# Patient Record
Sex: Male | Born: 1965 | Race: White | Hispanic: No | Marital: Married | Smoking: Never smoker
Health system: Southern US, Community
[De-identification: ages and names within clinical notes are randomized; demographics above are authoritative.]

## PROBLEM LIST (undated history)

## (undated) DIAGNOSIS — K4090 Unilateral inguinal hernia, without obstruction or gangrene, not specified as recurrent: Secondary | ICD-10-CM

## (undated) DIAGNOSIS — C20 Malignant neoplasm of rectum: Secondary | ICD-10-CM

## (undated) DIAGNOSIS — C801 Malignant (primary) neoplasm, unspecified: Secondary | ICD-10-CM

## (undated) DIAGNOSIS — K219 Gastro-esophageal reflux disease without esophagitis: Secondary | ICD-10-CM

## (undated) DIAGNOSIS — Z923 Personal history of irradiation: Secondary | ICD-10-CM

## (undated) HISTORY — PX: OTHER SURGICAL HISTORY: SHX169

## (undated) HISTORY — PX: RECTAL SURGERY: SHX760

## (undated) HISTORY — DX: Malignant (primary) neoplasm, unspecified: C80.1

## (undated) HISTORY — DX: Personal history of irradiation: Z92.3

---

## 2001-06-09 ENCOUNTER — Encounter: Payer: Self-pay | Admitting: Family Medicine

## 2001-06-09 ENCOUNTER — Ambulatory Visit (HOSPITAL_COMMUNITY): Admission: RE | Admit: 2001-06-09 | Discharge: 2001-06-09 | Payer: Self-pay | Admitting: Family Medicine

## 2004-10-27 ENCOUNTER — Emergency Department (HOSPITAL_COMMUNITY): Admission: EM | Admit: 2004-10-27 | Discharge: 2004-10-27 | Payer: Self-pay | Admitting: Emergency Medicine

## 2009-05-18 ENCOUNTER — Encounter: Admission: RE | Admit: 2009-05-18 | Discharge: 2009-05-18 | Payer: Self-pay | Admitting: Family Medicine

## 2010-11-06 ENCOUNTER — Ambulatory Visit (HOSPITAL_COMMUNITY)
Admission: RE | Admit: 2010-11-06 | Discharge: 2010-11-06 | Payer: Self-pay | Source: Home / Self Care | Admitting: Gastroenterology

## 2010-11-12 ENCOUNTER — Ambulatory Visit (HOSPITAL_COMMUNITY)
Admission: RE | Admit: 2010-11-12 | Discharge: 2010-11-12 | Payer: Self-pay | Source: Home / Self Care | Attending: Gastroenterology | Admitting: Gastroenterology

## 2010-12-06 LAB — CBC
HCT: 44.2 % (ref 39.0–52.0)
Hemoglobin: 15.2 g/dL (ref 13.0–17.0)
MCH: 30.5 pg (ref 26.0–34.0)
MCHC: 34.4 g/dL (ref 30.0–36.0)
MCV: 88.8 fL (ref 78.0–100.0)
Platelets: 226 10*3/uL (ref 150–400)
RBC: 4.98 MIL/uL (ref 4.22–5.81)
RDW: 12.5 % (ref 11.5–15.5)
WBC: 7.7 10*3/uL (ref 4.0–10.5)

## 2010-12-06 LAB — COMPREHENSIVE METABOLIC PANEL
ALT: 19 U/L (ref 0–53)
AST: 21 U/L (ref 0–37)
Albumin: 4.4 g/dL (ref 3.5–5.2)
Alkaline Phosphatase: 62 U/L (ref 39–117)
BUN: 17 mg/dL (ref 6–23)
CO2: 28 mEq/L (ref 19–32)
Calcium: 9.8 mg/dL (ref 8.4–10.5)
Chloride: 101 mEq/L (ref 96–112)
Creatinine, Ser: 0.94 mg/dL (ref 0.4–1.5)
GFR calc Af Amer: >60 mL/min (ref >60)
GFR calc non Af Amer: >60 mL/min (ref >60)
Glucose, Bld: 100 mg/dL — ABNORMAL HIGH (ref 70–99)
Potassium: 4.8 mEq/L (ref 3.5–5.1)
Sodium: 137 mEq/L (ref 135–145)
Total Bilirubin: 0.6 mg/dL (ref 0.3–1.2)
Total Protein: 7.1 g/dL (ref 6.0–8.3)

## 2010-12-06 LAB — DIFFERENTIAL
Basophils Absolute: 0 10*3/uL (ref 0.0–0.1)
Basophils Relative: 0 % (ref 0–1)
Eosinophils Absolute: 0.1 10*3/uL (ref 0.0–0.7)
Eosinophils Relative: 2 % (ref 0–5)
Lymphocytes Relative: 27 % (ref 12–46)
Lymphs Abs: 2.1 10*3/uL (ref 0.7–4.0)
Monocytes Absolute: 0.9 10*3/uL (ref 0.1–1.0)
Monocytes Relative: 11 % (ref 3–12)
Neutro Abs: 4.5 10*3/uL (ref 1.7–7.7)
Neutrophils Relative %: 59 % (ref 43–77)

## 2010-12-10 ENCOUNTER — Encounter (INDEPENDENT_AMBULATORY_CARE_PROVIDER_SITE_OTHER): Payer: Self-pay | Admitting: Surgery

## 2010-12-10 ENCOUNTER — Inpatient Hospital Stay (HOSPITAL_COMMUNITY)
Admission: RE | Admit: 2010-12-10 | Discharge: 2010-12-14 | Payer: Self-pay | Source: Home / Self Care | Attending: Surgery | Admitting: Surgery

## 2010-12-10 DIAGNOSIS — C20 Malignant neoplasm of rectum: Secondary | ICD-10-CM | POA: Insufficient documentation

## 2010-12-10 DIAGNOSIS — C801 Malignant (primary) neoplasm, unspecified: Secondary | ICD-10-CM

## 2010-12-10 HISTORY — DX: Malignant (primary) neoplasm, unspecified: C80.1

## 2010-12-16 LAB — COMPREHENSIVE METABOLIC PANEL
ALT: 12 U/L (ref 0–53)
AST: 17 U/L (ref 0–37)
Albumin: 2.4 g/dL — ABNORMAL LOW (ref 3.5–5.2)
Alkaline Phosphatase: 41 U/L (ref 39–117)
BUN: 10 mg/dL (ref 6–23)
CO2: 30 mEq/L (ref 19–32)
Calcium: 8.1 mg/dL — ABNORMAL LOW (ref 8.4–10.5)
Chloride: 104 mEq/L (ref 96–112)
Creatinine, Ser: 0.96 mg/dL (ref 0.4–1.5)
GFR calc Af Amer: >60 mL/min (ref >60)
GFR calc non Af Amer: >60 mL/min (ref >60)
Glucose, Bld: 94 mg/dL (ref 70–99)
Potassium: 3.7 mEq/L (ref 3.5–5.1)
Sodium: 138 mEq/L (ref 135–145)
Total Bilirubin: 0.8 mg/dL (ref 0.3–1.2)
Total Protein: 4.5 g/dL — ABNORMAL LOW (ref 6.0–8.3)

## 2010-12-16 LAB — CBC
HCT: 31.3 % — ABNORMAL LOW (ref 39.0–52.0)
Hemoglobin: 10.5 g/dL — ABNORMAL LOW (ref 13.0–17.0)
MCH: 29.7 pg (ref 26.0–34.0)
MCHC: 33.5 g/dL (ref 30.0–36.0)
MCV: 88.4 fL (ref 78.0–100.0)
Platelets: 167 10*3/uL (ref 150–400)
RBC: 3.54 MIL/uL — ABNORMAL LOW (ref 4.22–5.81)
RDW: 12.6 % (ref 11.5–15.5)
WBC: 11.1 10*3/uL — ABNORMAL HIGH (ref 4.0–10.5)

## 2010-12-16 LAB — CEA: CEA: 2.1 ng/mL (ref 0.0–5.0)

## 2010-12-24 ENCOUNTER — Ambulatory Visit: Payer: Self-pay | Admitting: Hematology and Oncology

## 2010-12-26 LAB — SURGICAL PCR SCREEN
MRSA, PCR: NEGATIVE
Staphylococcus aureus: NEGATIVE

## 2010-12-27 LAB — CBC WITH DIFFERENTIAL/PLATELET
BASO%: 0.5 % (ref 0.0–2.0)
Basophils Absolute: 0 10*3/uL (ref 0.0–0.1)
EOS%: 1.2 % (ref 0.0–7.0)
Eosinophils Absolute: 0.1 10*3/uL (ref 0.0–0.5)
HCT: 37.4 % — ABNORMAL LOW (ref 38.4–49.9)
HGB: 12.8 g/dL — ABNORMAL LOW (ref 13.0–17.1)
LYMPH%: 31.3 % (ref 14.0–49.0)
MCH: 30.6 pg (ref 27.2–33.4)
MCHC: 34.2 g/dL (ref 32.0–36.0)
MCV: 89.4 fL (ref 79.3–98.0)
MONO#: 0.8 10*3/uL (ref 0.1–0.9)
MONO%: 9.6 % (ref 0.0–14.0)
NEUT#: 5 10*3/uL (ref 1.5–6.5)
NEUT%: 57.4 % (ref 39.0–75.0)
Platelets: 310 10*3/uL (ref 140–400)
RBC: 4.18 10*6/uL — ABNORMAL LOW (ref 4.20–5.82)
RDW: 13.5 % (ref 11.0–14.6)
WBC: 8.8 10*3/uL (ref 4.0–10.3)
lymph#: 2.8 10*3/uL (ref 0.9–3.3)

## 2010-12-27 LAB — COMPREHENSIVE METABOLIC PANEL
ALT: 46 U/L (ref 0–53)
AST: 21 U/L (ref 0–37)
Albumin: 4 g/dL (ref 3.5–5.2)
Alkaline Phosphatase: 53 U/L (ref 39–117)
BUN: 16 mg/dL (ref 6–23)
CO2: 28 mEq/L (ref 19–32)
Calcium: 9.1 mg/dL (ref 8.4–10.5)
Chloride: 104 mEq/L (ref 96–112)
Creatinine, Ser: 0.86 mg/dL (ref 0.40–1.50)
Glucose, Bld: 79 mg/dL (ref 70–99)
Potassium: 3.9 mEq/L (ref 3.5–5.3)
Sodium: 139 mEq/L (ref 135–145)
Total Bilirubin: 0.6 mg/dL (ref 0.3–1.2)
Total Protein: 6.9 g/dL (ref 6.0–8.3)

## 2010-12-27 LAB — CEA: CEA: 2 ng/mL (ref 0.0–5.0)

## 2010-12-30 ENCOUNTER — Ambulatory Visit (HOSPITAL_COMMUNITY)
Admission: RE | Admit: 2010-12-30 | Discharge: 2010-12-30 | Payer: Self-pay | Source: Home / Self Care | Attending: Hematology and Oncology | Admitting: Hematology and Oncology

## 2011-01-01 ENCOUNTER — Ambulatory Visit: Payer: PRIVATE HEALTH INSURANCE | Attending: Radiation Oncology | Admitting: Radiation Oncology

## 2011-01-01 DIAGNOSIS — Z510 Encounter for antineoplastic radiation therapy: Secondary | ICD-10-CM | POA: Insufficient documentation

## 2011-01-01 DIAGNOSIS — K644 Residual hemorrhoidal skin tags: Secondary | ICD-10-CM | POA: Insufficient documentation

## 2011-01-01 DIAGNOSIS — Z8042 Family history of malignant neoplasm of prostate: Secondary | ICD-10-CM | POA: Insufficient documentation

## 2011-01-01 DIAGNOSIS — Z807 Family history of other malignant neoplasms of lymphoid, hematopoietic and related tissues: Secondary | ICD-10-CM | POA: Insufficient documentation

## 2011-01-01 DIAGNOSIS — R197 Diarrhea, unspecified: Secondary | ICD-10-CM | POA: Insufficient documentation

## 2011-01-01 DIAGNOSIS — Z79899 Other long term (current) drug therapy: Secondary | ICD-10-CM | POA: Insufficient documentation

## 2011-01-01 DIAGNOSIS — Z801 Family history of malignant neoplasm of trachea, bronchus and lung: Secondary | ICD-10-CM | POA: Insufficient documentation

## 2011-01-01 DIAGNOSIS — C186 Malignant neoplasm of descending colon: Secondary | ICD-10-CM | POA: Insufficient documentation

## 2011-01-01 DIAGNOSIS — Z800 Family history of malignant neoplasm of digestive organs: Secondary | ICD-10-CM | POA: Insufficient documentation

## 2011-01-07 NOTE — Discharge Summary (Signed)
  [name], Devon Bray             ACCOUNT NO.:  [id number]  MEDICAL RECORD NO.:  [id number]          PATIENT TYPE:  INP  LOCATION:  5150                         FACILITY:  [facility]  PHYSICIAN:  [place] A. Narmeen Kerper, M.D.DATE OF BIRTH:  1966-08-14  DATE OF ADMISSION:  12/10/2010 DATE OF DISCHARGE:  12/14/2010                              DISCHARGE SUMMARY   ADMITTING DIAGNOSIS:  Rectal cancer.  DISCHARGE DIAGNOSIS:  Rectal cancer.  PROCEDURES PERFORMED:  Laparoscopic low anterior resection.  BRIEF HISTORY:  The patient is a pleasant 45 year old male who has low rectal cancer.  He presents for resection.  HOSPITAL COURSE:  Unremarkable.  Bowel function returned by postop day #2.  He was discharged on postoperative day 4.  The wound was clean, dry, and intact.  Bowel function have resumed.  His diet was regular. Pain well controlled on p.o. pain medicines.  DISCHARGE INSTRUCTIONS:  He will follow up in 1-2 weeks.  He will refrain from lifting, driving, pushing, pulling until I see him back. He will go home on a regular diet.  He will be sent on Percocet for pain.  CONDITION ON DISCHARGE:  Improved.     [name], M.D.     TAC/MEDQ  D:  12/29/2010  T:  12/29/2010  Job:  [id number]  Electronically Signed by Harriette Bouillon M.D. on 01/07/2011 06:45:41 PM

## 2011-01-15 ENCOUNTER — Encounter (HOSPITAL_BASED_OUTPATIENT_CLINIC_OR_DEPARTMENT_OTHER): Payer: PRIVATE HEALTH INSURANCE | Admitting: Hematology & Oncology

## 2011-01-15 DIAGNOSIS — C20 Malignant neoplasm of rectum: Secondary | ICD-10-CM

## 2011-01-24 ENCOUNTER — Ambulatory Visit: Payer: PRIVATE HEALTH INSURANCE | Admitting: Radiation Oncology

## 2011-01-24 ENCOUNTER — Encounter (HOSPITAL_BASED_OUTPATIENT_CLINIC_OR_DEPARTMENT_OTHER)
Admission: RE | Admit: 2011-01-24 | Discharge: 2011-01-24 | Disposition: A | Discharge status: Home / Self Care | Payer: PRIVATE HEALTH INSURANCE | Source: Ambulatory Visit | Attending: Surgery | Admitting: Surgery

## 2011-01-24 DIAGNOSIS — Z01810 Encounter for preprocedural cardiovascular examination: Secondary | ICD-10-CM | POA: Insufficient documentation

## 2011-01-28 ENCOUNTER — Ambulatory Visit (HOSPITAL_COMMUNITY): Payer: PRIVATE HEALTH INSURANCE

## 2011-01-28 ENCOUNTER — Ambulatory Visit (HOSPITAL_BASED_OUTPATIENT_CLINIC_OR_DEPARTMENT_OTHER)
Admission: RE | Admit: 2011-01-28 | Discharge: 2011-01-28 | Disposition: A | Discharge status: Home / Self Care | Payer: PRIVATE HEALTH INSURANCE | Source: Ambulatory Visit | Attending: Surgery | Admitting: Surgery

## 2011-01-28 DIAGNOSIS — C20 Malignant neoplasm of rectum: Secondary | ICD-10-CM | POA: Insufficient documentation

## 2011-01-28 DIAGNOSIS — C189 Malignant neoplasm of colon, unspecified: Secondary | ICD-10-CM

## 2011-01-28 DIAGNOSIS — Z01812 Encounter for preprocedural laboratory examination: Secondary | ICD-10-CM | POA: Insufficient documentation

## 2011-01-28 DIAGNOSIS — Z01818 Encounter for other preprocedural examination: Secondary | ICD-10-CM | POA: Insufficient documentation

## 2011-01-28 LAB — POCT HEMOGLOBIN-HEMACUE: Hemoglobin: 14.9 g/dL (ref 13.0–17.0)

## 2011-02-04 NOTE — Op Note (Addendum)
Devon Bray, Devon Bray             ACCOUNT NO.:  [id number]  MEDICAL RECORD NO.:  [id number]           PATIENT TYPE:  LOCATION:                                 FACILITY:  PHYSICIAN:  Beyounce Dickens A. Cherly Erno, M.D.DATE OF BIRTH:  Aug 12, 1966  DATE OF PROCEDURE:  01/28/2011 DATE OF DISCHARGE:                              OPERATIVE REPORT   PREOPERATIVE DIAGNOSES: 1. Stage III rectal cancer. 2. Poor venous access.  POSTOPERATIVE DIAGNOSIS: 1. Stage III rectal cancer. 2. Poor venous access.  PROCEDURE:  Placement of right subclavian 8-French Power Port-A-Cath with fluoroscopy.  SURGEON:  Maisie Fus A. Abriella Filkins, MD.  ANESTHESIA:  LMA with 0.25% Sensorcaine local.  ESTIMATED BLOOD LOSS:  Minimal.  SPECIMENS:  None.  INDICATIONS FOR PROCEDURE:  The patient is a 45 year old male with stage III rectal cancer, in need of chemotherapy.  He needs Port-A-Cath for his therapy and presents today for venous access.  Risk of bleeding, infection, pneumothorax, hemothorax, pericardial tamponade, injury to mediastinal structures, injury to other arteries and nerves, potentially catheter migration, catheter fragmentation, catheter malfunction with infection requiring further surgery and/or replacement or removal of the catheter were all discussed.  He agreed to proceed.  DESCRIPTION OF PROCEDURE:  The patient is brought to the operating room and placed supine.  After induction of LMA anesthesia, upper chest region was prepped and draped in sterile fashion.  The patient was placed in Trendelenburg.  The right subclavian region was addressed. Sensorcaine 0.25% with epinephrine injected in the skin.  Needle was used to locate the clavicle, it was slight up under the clavicle entering the right subclavian vein with the return of dark nonpulsatile blood without difficulty.  Wire was fed through the needle.  The needle was removed and fluoroscopy showed the wire to be going down the right subclavian vein  into the superior vena cava down through the inferior vena cava.  The patient was flattened out.  Small stab wound was then done at the wire insertion site and the hemostat was used to dilate this.  Below this, about a 3 cm incision was made to create a pocket for the port itself.  This was done with combination of cautery and my finger down to the chest wall.  An 8-French Power port was brought on the field.  It was attached by myself and plate was flush.  It was tunneled from the lower incision to the upper incision.  I cut the catheter to be about 18 cm, which corresponded to his angle of Louis from the skin.  At this point, we put the patient back in Trendelenburg. I passed the dilator over the wire moving the wire to-and-fro without resistance, and then put the dilator introducer complex over the wire, advanced it moving the wire to-and-fro without encountering resistance. The dilator and wire removed.  A peel-away sheath was left in place. The catheter was put to the peel-away sheath and the peel-away sheath was peeled away without difficulty.  Fluoroscopy showed the tip to be in the proximal superior vena cava.  It drew back easily and flushed easily at this point in time of dark blood.  We secured the catheter itself to the chest wall with 2-0 Prolene x2.  I also reviewed the fluoroscopy and showed no obvious pneumothorax on the right.  We then closed skin with 3- 0 Vicryl and subsequent 4-0 Monocryl stitch for both incisions. Dermabond was applied.  All final counts of sponge, needle, and instruments found to be correct for this portion of the case.  The patient was then taken to recovery in satisfactory condition.  Chest x- ray pending.     [name], M.D.     TAC/MEDQ  D:  01/28/2011  T:  01/28/2011  Job:  [id number]  cc:   Vicente Serene I. Odogwu, M.D.  Electronically Signed by Harriette Bouillon M.D. on 02/04/2011 10:35:16 AM Electronically Signed by Harriette Bouillon M.D.  on 02/04/2011 10:43:44 AM Electronically Signed by Harriette Bouillon M.D. on 02/04/2011 10:53:41 AM Electronically Signed by Harriette Bouillon M.D. on 02/04/2011 11:03:47 AM Electronically Signed by Harriette Bouillon M.D. on 02/04/2011 11:13:58 AM Electronically Signed by Harriette Bouillon M.D. on 02/04/2011 11:25:52 AM Electronically Signed by Harriette Bouillon M.D. on 02/04/2011 11:39:05 AM Electronically Signed by Harriette Bouillon M.D. on 02/04/2011 11:52:28 AM Electronically Signed by Harriette Bouillon M.D. on 02/04/2011 12:07:07 PM Electronically Signed by Harriette Bouillon M.D. on 02/04/2011 12:22:26 PM Electronically Signed by Harriette Bouillon M.D. on 02/04/2011 12:38:28 PM Electronically Signed by Harriette Bouillon M.D. on 02/04/2011 12:56:33 PM Electronically Signed by Harriette Bouillon M.D. on 02/04/2011 01:14:50 PM Electronically Signed by Harriette Bouillon M.D. on 02/04/2011 01:36:43 PM Electronically Signed by Harriette Bouillon M.D. on 02/04/2011 01:56:51 PM Electronically Signed by Harriette Bouillon M.D. on 02/04/2011 02:16:49 PM Electronically Signed by Harriette Bouillon M.D. on 02/04/2011 02:38:58 PM Electronically Signed by Harriette Bouillon M.D. on 02/04/2011 03:02:10 PM Electronically Signed by Harriette Bouillon M.D. on 02/04/2011 03:27:07 PM Electronically Signed by Harriette Bouillon M.D. on 02/04/2011 03:52:47 PM Electronically Signed by Harriette Bouillon M.D. on 02/04/2011 04:00:35 PM Electronically Signed by Harriette Bouillon M.D. on 02/04/2011 04:31:39 PM Electronically Signed by Harriette Bouillon M.D. on 02/04/2011 05:03:04 PM Electronically Signed by Harriette Bouillon M.D. on 02/04/2011 05:03:04 PM Electronically Signed by Harriette Bouillon M.D. on 02/04/2011 05:29:43 PM Electronically Signed by Harriette Bouillon M.D. on 02/04/2011 05:29:43 PM Electronically Signed by Harriette Bouillon M.D. on 02/04/2011 06:11:55 PM Electronically Signed by Harriette Bouillon M.D. on 02/04/2011 06:20:18 PM Electronically Signed by Harriette Bouillon M.D. on  02/04/2011 07:43:20 PM

## 2011-02-06 ENCOUNTER — Other Ambulatory Visit: Payer: Self-pay | Admitting: Hematology and Oncology

## 2011-02-06 ENCOUNTER — Encounter (HOSPITAL_BASED_OUTPATIENT_CLINIC_OR_DEPARTMENT_OTHER): Payer: PRIVATE HEALTH INSURANCE | Admitting: Hematology and Oncology

## 2011-02-06 DIAGNOSIS — C20 Malignant neoplasm of rectum: Secondary | ICD-10-CM

## 2011-02-06 DIAGNOSIS — C19 Malignant neoplasm of rectosigmoid junction: Secondary | ICD-10-CM

## 2011-02-06 LAB — CBC WITH DIFFERENTIAL/PLATELET
BASO%: 0.3 % (ref 0.0–2.0)
Basophils Absolute: 0 10*3/uL (ref 0.0–0.1)
EOS%: 1.6 % (ref 0.0–7.0)
Eosinophils Absolute: 0.1 10*3/uL (ref 0.0–0.5)
HCT: 39.4 % (ref 38.4–49.9)
HGB: 13.6 g/dL (ref 13.0–17.1)
LYMPH%: 33 % (ref 14.0–49.0)
MCH: 30.1 pg (ref 27.2–33.4)
MCHC: 34.3 g/dL (ref 32.0–36.0)
MCV: 87.7 fL (ref 79.3–98.0)
MONO#: 0.8 10*3/uL (ref 0.1–0.9)
MONO%: 11.5 % (ref 0.0–14.0)
NEUT#: 3.5 10*3/uL (ref 1.5–6.5)
NEUT%: 53.6 % (ref 39.0–75.0)
Platelets: 194 10*3/uL (ref 140–400)
RBC: 4.5 10*6/uL (ref 4.20–5.82)
RDW: 13.1 % (ref 11.0–14.6)
WBC: 6.6 10*3/uL (ref 4.0–10.3)
lymph#: 2.2 10*3/uL (ref 0.9–3.3)

## 2011-02-06 LAB — COMPREHENSIVE METABOLIC PANEL
ALT: 26 U/L (ref 0–53)
AST: 18 U/L (ref 0–37)
Albumin: 4.3 g/dL (ref 3.5–5.2)
Alkaline Phosphatase: 65 U/L (ref 39–117)
BUN: 19 mg/dL (ref 6–23)
CO2: 27 mEq/L (ref 19–32)
Calcium: 9 mg/dL (ref 8.4–10.5)
Chloride: 103 mEq/L (ref 96–112)
Creatinine, Ser: 1.07 mg/dL (ref 0.40–1.50)
Glucose, Bld: 74 mg/dL (ref 70–99)
Potassium: 4.2 mEq/L (ref 3.5–5.3)
Sodium: 140 mEq/L (ref 135–145)
Total Bilirubin: 0.4 mg/dL (ref 0.3–1.2)
Total Protein: 6.3 g/dL (ref 6.0–8.3)

## 2011-02-10 ENCOUNTER — Encounter (HOSPITAL_BASED_OUTPATIENT_CLINIC_OR_DEPARTMENT_OTHER): Payer: PRIVATE HEALTH INSURANCE | Admitting: Hematology and Oncology

## 2011-02-10 DIAGNOSIS — C20 Malignant neoplasm of rectum: Secondary | ICD-10-CM

## 2011-02-10 DIAGNOSIS — Z5112 Encounter for antineoplastic immunotherapy: Secondary | ICD-10-CM

## 2011-02-10 LAB — GLUCOSE, CAPILLARY: Glucose-Capillary: 105 mg/dL — ABNORMAL HIGH (ref 70–99)

## 2011-02-17 ENCOUNTER — Other Ambulatory Visit: Payer: Self-pay | Admitting: Hematology and Oncology

## 2011-02-17 ENCOUNTER — Encounter (HOSPITAL_BASED_OUTPATIENT_CLINIC_OR_DEPARTMENT_OTHER): Payer: PRIVATE HEALTH INSURANCE | Admitting: Hematology and Oncology

## 2011-02-17 DIAGNOSIS — C20 Malignant neoplasm of rectum: Secondary | ICD-10-CM

## 2011-02-17 DIAGNOSIS — Z5111 Encounter for antineoplastic chemotherapy: Secondary | ICD-10-CM

## 2011-02-17 LAB — CBC WITH DIFFERENTIAL/PLATELET
BASO%: 0.2 % (ref 0.0–2.0)
Basophils Absolute: 0 10*3/uL (ref 0.0–0.1)
EOS%: 1.6 % (ref 0.0–7.0)
Eosinophils Absolute: 0.1 10*3/uL (ref 0.0–0.5)
HCT: 40.6 % (ref 38.4–49.9)
HGB: 14 g/dL (ref 13.0–17.1)
LYMPH%: 28.5 % (ref 14.0–49.0)
MCH: 29.6 pg (ref 27.2–33.4)
MCHC: 34.5 g/dL (ref 32.0–36.0)
MCV: 85.8 fL (ref 79.3–98.0)
MONO#: 0.5 10*3/uL (ref 0.1–0.9)
MONO%: 10.4 % (ref 0.0–14.0)
NEUT#: 2.6 10*3/uL (ref 1.5–6.5)
NEUT%: 59.3 % (ref 39.0–75.0)
Platelets: 193 10*3/uL (ref 140–400)
RBC: 4.73 10*6/uL (ref 4.20–5.82)
RDW: 12.8 % (ref 11.0–14.6)
WBC: 4.4 10*3/uL (ref 4.0–10.3)
lymph#: 1.3 10*3/uL (ref 0.9–3.3)
nRBC: 0 % (ref 0–0)

## 2011-02-24 ENCOUNTER — Other Ambulatory Visit: Payer: Self-pay | Admitting: Hematology and Oncology

## 2011-02-24 ENCOUNTER — Encounter (HOSPITAL_BASED_OUTPATIENT_CLINIC_OR_DEPARTMENT_OTHER): Payer: PRIVATE HEALTH INSURANCE | Admitting: Hematology and Oncology

## 2011-02-24 DIAGNOSIS — Z5111 Encounter for antineoplastic chemotherapy: Secondary | ICD-10-CM

## 2011-02-24 DIAGNOSIS — C20 Malignant neoplasm of rectum: Secondary | ICD-10-CM

## 2011-02-24 LAB — CBC WITH DIFFERENTIAL/PLATELET
BASO%: 0.4 % (ref 0.0–2.0)
Basophils Absolute: 0 10*3/uL (ref 0.0–0.1)
EOS%: 2.5 % (ref 0.0–7.0)
Eosinophils Absolute: 0.1 10*3/uL (ref 0.0–0.5)
HCT: 41.2 % (ref 38.4–49.9)
HGB: 14.4 g/dL (ref 13.0–17.1)
LYMPH%: 19.7 % (ref 14.0–49.0)
MCH: 29.8 pg (ref 27.2–33.4)
MCHC: 35 g/dL (ref 32.0–36.0)
MCV: 85.1 fL (ref 79.3–98.0)
MONO#: 0.6 10*3/uL (ref 0.1–0.9)
MONO%: 12.1 % (ref 0.0–14.0)
NEUT#: 3.1 10*3/uL (ref 1.5–6.5)
NEUT%: 65.3 % (ref 39.0–75.0)
Platelets: 170 10*3/uL (ref 140–400)
RBC: 4.84 10*6/uL (ref 4.20–5.82)
RDW: 13.3 % (ref 11.0–14.6)
WBC: 4.7 10*3/uL (ref 4.0–10.3)
lymph#: 0.9 10*3/uL (ref 0.9–3.3)

## 2011-02-24 LAB — BASIC METABOLIC PANEL
BUN: 13 mg/dL (ref 6–23)
CO2: 25 mEq/L (ref 19–32)
Calcium: 9 mg/dL (ref 8.4–10.5)
Chloride: 100 mEq/L (ref 96–112)
Creatinine, Ser: 0.9 mg/dL (ref 0.40–1.50)
Glucose, Bld: 126 mg/dL — ABNORMAL HIGH (ref 70–99)
Potassium: 3.5 mEq/L (ref 3.5–5.3)
Sodium: 137 mEq/L (ref 135–145)

## 2011-02-27 ENCOUNTER — Encounter (HOSPITAL_BASED_OUTPATIENT_CLINIC_OR_DEPARTMENT_OTHER): Payer: PRIVATE HEALTH INSURANCE | Admitting: Hematology and Oncology

## 2011-02-27 DIAGNOSIS — C20 Malignant neoplasm of rectum: Secondary | ICD-10-CM

## 2011-03-03 ENCOUNTER — Other Ambulatory Visit: Payer: Self-pay | Admitting: Hematology and Oncology

## 2011-03-03 ENCOUNTER — Encounter (HOSPITAL_BASED_OUTPATIENT_CLINIC_OR_DEPARTMENT_OTHER): Payer: PRIVATE HEALTH INSURANCE | Admitting: Hematology and Oncology

## 2011-03-03 DIAGNOSIS — Z5111 Encounter for antineoplastic chemotherapy: Secondary | ICD-10-CM

## 2011-03-03 DIAGNOSIS — C20 Malignant neoplasm of rectum: Secondary | ICD-10-CM

## 2011-03-03 LAB — CBC WITH DIFFERENTIAL/PLATELET
BASO%: 0.2 % (ref 0.0–2.0)
Basophils Absolute: 0 10*3/uL (ref 0.0–0.1)
EOS%: 3.1 % (ref 0.0–7.0)
Eosinophils Absolute: 0.2 10*3/uL (ref 0.0–0.5)
HCT: 41.5 % (ref 38.4–49.9)
HGB: 14.6 g/dL (ref 13.0–17.1)
LYMPH%: 13.9 % — ABNORMAL LOW (ref 14.0–49.0)
MCH: 30.2 pg (ref 27.2–33.4)
MCHC: 35.2 g/dL (ref 32.0–36.0)
MCV: 85.9 fL (ref 79.3–98.0)
MONO#: 0.8 10*3/uL (ref 0.1–0.9)
MONO%: 16 % — ABNORMAL HIGH (ref 0.0–14.0)
NEUT#: 3.3 10*3/uL (ref 1.5–6.5)
NEUT%: 66.8 % (ref 39.0–75.0)
Platelets: 160 10*3/uL (ref 140–400)
RBC: 4.83 10*6/uL (ref 4.20–5.82)
RDW: 14.1 % (ref 11.0–14.6)
WBC: 4.9 10*3/uL (ref 4.0–10.3)
lymph#: 0.7 10*3/uL — ABNORMAL LOW (ref 0.9–3.3)
nRBC: 0 % (ref 0–0)

## 2011-03-03 LAB — BASIC METABOLIC PANEL
BUN: 14 mg/dL (ref 6–23)
CO2: 27 mEq/L (ref 19–32)
Calcium: 9.7 mg/dL (ref 8.4–10.5)
Chloride: 100 mEq/L (ref 96–112)
Creatinine, Ser: 1 mg/dL (ref 0.40–1.50)
Glucose, Bld: 95 mg/dL (ref 70–99)
Potassium: 3.8 mEq/L (ref 3.5–5.3)
Sodium: 137 mEq/L (ref 135–145)

## 2011-03-10 ENCOUNTER — Other Ambulatory Visit: Payer: Self-pay | Admitting: Hematology and Oncology

## 2011-03-10 ENCOUNTER — Encounter (HOSPITAL_BASED_OUTPATIENT_CLINIC_OR_DEPARTMENT_OTHER): Payer: PRIVATE HEALTH INSURANCE | Admitting: Hematology and Oncology

## 2011-03-10 DIAGNOSIS — Z5111 Encounter for antineoplastic chemotherapy: Secondary | ICD-10-CM

## 2011-03-10 DIAGNOSIS — C19 Malignant neoplasm of rectosigmoid junction: Secondary | ICD-10-CM

## 2011-03-10 DIAGNOSIS — C20 Malignant neoplasm of rectum: Secondary | ICD-10-CM

## 2011-03-10 DIAGNOSIS — Z5112 Encounter for antineoplastic immunotherapy: Secondary | ICD-10-CM

## 2011-03-10 LAB — CBC WITH DIFFERENTIAL/PLATELET
BASO%: 0.3 % (ref 0.0–2.0)
Basophils Absolute: 0 10*3/uL (ref 0.0–0.1)
EOS%: 2.8 % (ref 0.0–7.0)
Eosinophils Absolute: 0.2 10*3/uL (ref 0.0–0.5)
HCT: 41 % (ref 38.4–49.9)
HGB: 14.6 g/dL (ref 13.0–17.1)
LYMPH%: 7.4 % — ABNORMAL LOW (ref 14.0–49.0)
MCH: 30.5 pg (ref 27.2–33.4)
MCHC: 35.6 g/dL (ref 32.0–36.0)
MCV: 85.8 fL (ref 79.3–98.0)
MONO#: 1 10*3/uL — ABNORMAL HIGH (ref 0.1–0.9)
MONO%: 14.6 % — ABNORMAL HIGH (ref 0.0–14.0)
NEUT#: 5.1 10*3/uL (ref 1.5–6.5)
NEUT%: 74.9 % (ref 39.0–75.0)
Platelets: 189 10*3/uL (ref 140–400)
RBC: 4.78 10*6/uL (ref 4.20–5.82)
RDW: 14.8 % — ABNORMAL HIGH (ref 11.0–14.6)
WBC: 6.8 10*3/uL (ref 4.0–10.3)
lymph#: 0.5 10*3/uL — ABNORMAL LOW (ref 0.9–3.3)
nRBC: 0 % (ref 0–0)

## 2011-03-10 LAB — BASIC METABOLIC PANEL
BUN: 13 mg/dL (ref 6–23)
CO2: 24 mEq/L (ref 19–32)
Calcium: 9.2 mg/dL (ref 8.4–10.5)
Chloride: 101 mEq/L (ref 96–112)
Creatinine, Ser: 0.86 mg/dL (ref 0.40–1.50)
Glucose, Bld: 119 mg/dL — ABNORMAL HIGH (ref 70–99)
Potassium: 3.5 mEq/L (ref 3.5–5.3)
Sodium: 137 mEq/L (ref 135–145)

## 2011-03-12 ENCOUNTER — Encounter (HOSPITAL_BASED_OUTPATIENT_CLINIC_OR_DEPARTMENT_OTHER): Payer: PRIVATE HEALTH INSURANCE | Admitting: Hematology and Oncology

## 2011-03-12 ENCOUNTER — Other Ambulatory Visit: Payer: Self-pay | Admitting: Hematology and Oncology

## 2011-03-12 DIAGNOSIS — C20 Malignant neoplasm of rectum: Secondary | ICD-10-CM

## 2011-03-12 DIAGNOSIS — Z5112 Encounter for antineoplastic immunotherapy: Secondary | ICD-10-CM

## 2011-03-12 DIAGNOSIS — C19 Malignant neoplasm of rectosigmoid junction: Secondary | ICD-10-CM

## 2011-03-17 ENCOUNTER — Other Ambulatory Visit: Payer: Self-pay | Admitting: Hematology and Oncology

## 2011-03-17 ENCOUNTER — Encounter (HOSPITAL_BASED_OUTPATIENT_CLINIC_OR_DEPARTMENT_OTHER): Payer: PRIVATE HEALTH INSURANCE | Admitting: Hematology and Oncology

## 2011-03-17 DIAGNOSIS — C20 Malignant neoplasm of rectum: Secondary | ICD-10-CM

## 2011-03-17 DIAGNOSIS — Z5112 Encounter for antineoplastic immunotherapy: Secondary | ICD-10-CM

## 2011-03-17 DIAGNOSIS — Z5111 Encounter for antineoplastic chemotherapy: Secondary | ICD-10-CM

## 2011-03-17 DIAGNOSIS — C19 Malignant neoplasm of rectosigmoid junction: Secondary | ICD-10-CM

## 2011-03-17 LAB — CBC WITH DIFFERENTIAL/PLATELET
Basophils Absolute: 0 10*3/uL (ref 0.0–0.1)
EOS%: 3.5 % (ref 0.0–7.0)
Eosinophils Absolute: 0.3 10*3/uL (ref 0.0–0.5)
HCT: 41.5 % (ref 38.4–49.9)
HGB: 14.4 g/dL (ref 13.0–17.1)
MCH: 30.1 pg (ref 27.2–33.4)
MONO#: 0.7 10*3/uL (ref 0.1–0.9)
NEUT#: 5.8 10*3/uL (ref 1.5–6.5)
NEUT%: 77.1 % — ABNORMAL HIGH (ref 39.0–75.0)
RDW: 15.2 % — ABNORMAL HIGH (ref 11.0–14.6)
lymph#: 0.8 10*3/uL — ABNORMAL LOW (ref 0.9–3.3)

## 2011-03-20 ENCOUNTER — Encounter (HOSPITAL_BASED_OUTPATIENT_CLINIC_OR_DEPARTMENT_OTHER): Payer: PRIVATE HEALTH INSURANCE | Admitting: Hematology and Oncology

## 2011-03-20 DIAGNOSIS — C20 Malignant neoplasm of rectum: Secondary | ICD-10-CM

## 2011-03-26 ENCOUNTER — Other Ambulatory Visit (HOSPITAL_COMMUNITY): Payer: PRIVATE HEALTH INSURANCE

## 2011-03-31 ENCOUNTER — Encounter (HOSPITAL_COMMUNITY): Payer: Self-pay

## 2011-03-31 ENCOUNTER — Ambulatory Visit (HOSPITAL_COMMUNITY)
Admission: RE | Admit: 2011-03-31 | Discharge: 2011-03-31 | Disposition: A | Discharge status: Home / Self Care | Payer: PRIVATE HEALTH INSURANCE | Source: Ambulatory Visit | Attending: Hematology and Oncology | Admitting: Hematology and Oncology

## 2011-03-31 DIAGNOSIS — Z09 Encounter for follow-up examination after completed treatment for conditions other than malignant neoplasm: Secondary | ICD-10-CM | POA: Insufficient documentation

## 2011-03-31 DIAGNOSIS — Z980 Intestinal bypass and anastomosis status: Secondary | ICD-10-CM | POA: Insufficient documentation

## 2011-03-31 DIAGNOSIS — C20 Malignant neoplasm of rectum: Secondary | ICD-10-CM

## 2011-03-31 DIAGNOSIS — Z85048 Personal history of other malignant neoplasm of rectum, rectosigmoid junction, and anus: Secondary | ICD-10-CM | POA: Insufficient documentation

## 2011-03-31 HISTORY — DX: Malignant neoplasm of rectum: C20

## 2011-03-31 MED ORDER — IOHEXOL 300 MG/ML  SOLN
100.0000 mL | Freq: Once | INTRAMUSCULAR | Status: AC | PRN
  Administered 2011-03-31: 100 mL via INTRAVENOUS

## 2011-04-02 ENCOUNTER — Other Ambulatory Visit: Payer: Self-pay | Admitting: Hematology and Oncology

## 2011-04-02 ENCOUNTER — Encounter (HOSPITAL_BASED_OUTPATIENT_CLINIC_OR_DEPARTMENT_OTHER): Payer: BC Managed Care – PPO | Admitting: Hematology and Oncology

## 2011-04-02 DIAGNOSIS — C20 Malignant neoplasm of rectum: Secondary | ICD-10-CM

## 2011-04-02 LAB — CBC WITH DIFFERENTIAL/PLATELET
Basophils Absolute: 0 10*3/uL (ref 0.0–0.1)
Eosinophils Absolute: 0.3 10*3/uL (ref 0.0–0.5)
HCT: 38.1 % — ABNORMAL LOW (ref 38.4–49.9)
HGB: 13.1 g/dL (ref 13.0–17.1)
LYMPH%: 7 % — ABNORMAL LOW (ref 14.0–49.0)
MCV: 90.5 fL (ref 79.3–98.0)
MONO#: 0.7 10*3/uL (ref 0.1–0.9)
MONO%: 11.1 % (ref 0.0–14.0)
NEUT#: 5.2 10*3/uL (ref 1.5–6.5)
NEUT%: 77.9 % — ABNORMAL HIGH (ref 39.0–75.0)
Platelets: 252 10*3/uL (ref 140–400)
WBC: 6.6 10*3/uL (ref 4.0–10.3)

## 2011-04-02 LAB — COMPREHENSIVE METABOLIC PANEL
Alkaline Phosphatase: 73 U/L (ref 39–117)
BUN: 12 mg/dL (ref 6–23)
CO2: 23 mEq/L (ref 19–32)
Glucose, Bld: 96 mg/dL (ref 70–99)
Sodium: 138 mEq/L (ref 135–145)
Total Bilirubin: 0.4 mg/dL (ref 0.3–1.2)
Total Protein: 6.6 g/dL (ref 6.0–8.3)

## 2011-04-02 LAB — CEA: CEA: 2.5 ng/mL (ref 0.0–5.0)

## 2011-04-18 ENCOUNTER — Ambulatory Visit
Admission: RE | Admit: 2011-04-18 | Discharge: 2011-04-18 | Disposition: A | Discharge status: Home / Self Care | Payer: BC Managed Care – PPO | Source: Ambulatory Visit | Attending: Radiation Oncology | Admitting: Radiation Oncology

## 2011-05-13 ENCOUNTER — Other Ambulatory Visit: Payer: Self-pay | Admitting: Hematology and Oncology

## 2011-05-13 ENCOUNTER — Encounter (HOSPITAL_BASED_OUTPATIENT_CLINIC_OR_DEPARTMENT_OTHER): Payer: BC Managed Care – PPO | Admitting: Hematology and Oncology

## 2011-05-13 DIAGNOSIS — C20 Malignant neoplasm of rectum: Secondary | ICD-10-CM

## 2011-05-13 LAB — CBC WITH DIFFERENTIAL/PLATELET
Basophils Absolute: 0 10*3/uL (ref 0.0–0.1)
HCT: 39.1 % (ref 38.4–49.9)
HGB: 13.3 g/dL (ref 13.0–17.1)
MCH: 31.5 pg (ref 27.2–33.4)
MONO#: 0.6 10*3/uL (ref 0.1–0.9)
NEUT%: 64.7 % (ref 39.0–75.0)
WBC: 5.2 10*3/uL (ref 4.0–10.3)
lymph#: 0.8 10*3/uL — ABNORMAL LOW (ref 0.9–3.3)

## 2011-05-13 LAB — COMPREHENSIVE METABOLIC PANEL
BUN: 16 mg/dL (ref 6–23)
CO2: 27 mEq/L (ref 19–32)
Calcium: 9.3 mg/dL (ref 8.4–10.5)
Chloride: 101 mEq/L (ref 96–112)
Creatinine, Ser: 0.85 mg/dL (ref 0.50–1.35)
Glucose, Bld: 77 mg/dL (ref 70–99)

## 2011-05-13 LAB — CEA: CEA: 1.8 ng/mL (ref 0.0–5.0)

## 2011-05-14 ENCOUNTER — Encounter (HOSPITAL_BASED_OUTPATIENT_CLINIC_OR_DEPARTMENT_OTHER): Payer: BC Managed Care – PPO | Admitting: Hematology and Oncology

## 2011-05-14 DIAGNOSIS — C20 Malignant neoplasm of rectum: Secondary | ICD-10-CM

## 2011-05-14 DIAGNOSIS — Z5111 Encounter for antineoplastic chemotherapy: Secondary | ICD-10-CM

## 2011-06-03 ENCOUNTER — Other Ambulatory Visit: Payer: Self-pay | Admitting: Hematology and Oncology

## 2011-06-03 ENCOUNTER — Encounter (HOSPITAL_BASED_OUTPATIENT_CLINIC_OR_DEPARTMENT_OTHER): Payer: BC Managed Care – PPO | Admitting: Hematology and Oncology

## 2011-06-03 DIAGNOSIS — C20 Malignant neoplasm of rectum: Secondary | ICD-10-CM

## 2011-06-03 DIAGNOSIS — Z5111 Encounter for antineoplastic chemotherapy: Secondary | ICD-10-CM

## 2011-06-03 LAB — CBC WITH DIFFERENTIAL/PLATELET
Basophils Absolute: 0 10*3/uL (ref 0.0–0.1)
EOS%: 1.2 % (ref 0.0–7.0)
Eosinophils Absolute: 0 10*3/uL (ref 0.0–0.5)
HGB: 13.1 g/dL (ref 13.0–17.1)
MCH: 33 pg (ref 27.2–33.4)
NEUT#: 2.3 10*3/uL (ref 1.5–6.5)
RDW: 14.3 % (ref 11.0–14.6)
lymph#: 0.6 10*3/uL — ABNORMAL LOW (ref 0.9–3.3)

## 2011-06-03 LAB — COMPREHENSIVE METABOLIC PANEL
ALT: 50 U/L (ref 0–53)
Alkaline Phosphatase: 62 U/L (ref 39–117)
CO2: 29 mEq/L (ref 19–32)
Creatinine, Ser: 0.93 mg/dL (ref 0.50–1.35)
Glucose, Bld: 84 mg/dL (ref 70–99)
Sodium: 139 mEq/L (ref 135–145)
Total Bilirubin: 0.4 mg/dL (ref 0.3–1.2)

## 2011-06-05 ENCOUNTER — Encounter (HOSPITAL_BASED_OUTPATIENT_CLINIC_OR_DEPARTMENT_OTHER): Payer: BC Managed Care – PPO | Admitting: Hematology and Oncology

## 2011-06-05 ENCOUNTER — Emergency Department (HOSPITAL_COMMUNITY)
Admission: EM | Admit: 2011-06-05 | Discharge: 2011-06-05 | Disposition: A | Discharge status: Home / Self Care | Payer: BC Managed Care – PPO | Attending: Emergency Medicine | Admitting: Emergency Medicine

## 2011-06-05 DIAGNOSIS — Z79899 Other long term (current) drug therapy: Secondary | ICD-10-CM | POA: Insufficient documentation

## 2011-06-05 DIAGNOSIS — Z5111 Encounter for antineoplastic chemotherapy: Secondary | ICD-10-CM

## 2011-06-05 DIAGNOSIS — X58XXXA Exposure to other specified factors, initial encounter: Secondary | ICD-10-CM | POA: Insufficient documentation

## 2011-06-05 DIAGNOSIS — C20 Malignant neoplasm of rectum: Secondary | ICD-10-CM | POA: Insufficient documentation

## 2011-06-05 DIAGNOSIS — T7840XA Allergy, unspecified, initial encounter: Secondary | ICD-10-CM | POA: Insufficient documentation

## 2011-06-24 ENCOUNTER — Other Ambulatory Visit: Payer: Self-pay | Admitting: Hematology and Oncology

## 2011-06-24 ENCOUNTER — Encounter (HOSPITAL_BASED_OUTPATIENT_CLINIC_OR_DEPARTMENT_OTHER): Payer: BC Managed Care – PPO | Admitting: Hematology and Oncology

## 2011-06-24 DIAGNOSIS — C20 Malignant neoplasm of rectum: Secondary | ICD-10-CM

## 2011-06-24 LAB — COMPREHENSIVE METABOLIC PANEL
Alkaline Phosphatase: 53 U/L (ref 39–117)
BUN: 15 mg/dL (ref 6–23)
CO2: 25 mEq/L (ref 19–32)
Creatinine, Ser: 0.91 mg/dL (ref 0.50–1.35)
Glucose, Bld: 95 mg/dL (ref 70–99)
Total Bilirubin: 0.3 mg/dL (ref 0.3–1.2)

## 2011-06-24 LAB — CBC WITH DIFFERENTIAL/PLATELET
Eosinophils Absolute: 0.1 10*3/uL (ref 0.0–0.5)
HCT: 36.3 % — ABNORMAL LOW (ref 38.4–49.9)
LYMPH%: 17.8 % (ref 14.0–49.0)
MCV: 91.7 fL (ref 79.3–98.0)
MONO#: 1 10*3/uL — ABNORMAL HIGH (ref 0.1–0.9)
MONO%: 23.8 % — ABNORMAL HIGH (ref 0.0–14.0)
NEUT#: 2.5 10*3/uL (ref 1.5–6.5)
NEUT%: 57 % (ref 39.0–75.0)
Platelets: 199 10*3/uL (ref 140–400)
WBC: 4.3 10*3/uL (ref 4.0–10.3)

## 2011-06-25 ENCOUNTER — Encounter: Payer: BC Managed Care – PPO | Admitting: Hematology and Oncology

## 2011-07-15 ENCOUNTER — Other Ambulatory Visit: Payer: Self-pay | Admitting: Hematology and Oncology

## 2011-07-15 ENCOUNTER — Encounter (HOSPITAL_BASED_OUTPATIENT_CLINIC_OR_DEPARTMENT_OTHER): Payer: BC Managed Care – PPO | Admitting: Hematology and Oncology

## 2011-07-15 DIAGNOSIS — Z5111 Encounter for antineoplastic chemotherapy: Secondary | ICD-10-CM

## 2011-07-15 DIAGNOSIS — C20 Malignant neoplasm of rectum: Secondary | ICD-10-CM

## 2011-07-15 LAB — CBC WITH DIFFERENTIAL/PLATELET
Basophils Absolute: 0 10*3/uL (ref 0.0–0.1)
Eosinophils Absolute: 0.1 10*3/uL (ref 0.0–0.5)
HCT: 37.6 % — ABNORMAL LOW (ref 38.4–49.9)
HGB: 12.7 g/dL — ABNORMAL LOW (ref 13.0–17.1)
LYMPH%: 23.2 % (ref 14.0–49.0)
MONO#: 0.8 10*3/uL (ref 0.1–0.9)
NEUT#: 2.4 10*3/uL (ref 1.5–6.5)
Platelets: 166 10*3/uL (ref 140–400)
RBC: 3.94 10*6/uL — ABNORMAL LOW (ref 4.20–5.82)
WBC: 4.2 10*3/uL (ref 4.0–10.3)

## 2011-07-15 LAB — COMPREHENSIVE METABOLIC PANEL
Albumin: 3.7 g/dL (ref 3.5–5.2)
BUN: 18 mg/dL (ref 6–23)
CO2: 28 mEq/L (ref 19–32)
Glucose, Bld: 89 mg/dL (ref 70–99)
Sodium: 138 mEq/L (ref 135–145)
Total Bilirubin: 0.3 mg/dL (ref 0.3–1.2)
Total Protein: 6.6 g/dL (ref 6.0–8.3)

## 2011-07-17 ENCOUNTER — Encounter (HOSPITAL_BASED_OUTPATIENT_CLINIC_OR_DEPARTMENT_OTHER): Payer: BC Managed Care – PPO | Admitting: Hematology and Oncology

## 2011-08-06 ENCOUNTER — Encounter (HOSPITAL_BASED_OUTPATIENT_CLINIC_OR_DEPARTMENT_OTHER): Payer: BC Managed Care – PPO | Admitting: Hematology and Oncology

## 2011-08-06 ENCOUNTER — Other Ambulatory Visit: Payer: Self-pay | Admitting: Hematology and Oncology

## 2011-08-06 DIAGNOSIS — Z5111 Encounter for antineoplastic chemotherapy: Secondary | ICD-10-CM

## 2011-08-06 DIAGNOSIS — C20 Malignant neoplasm of rectum: Secondary | ICD-10-CM

## 2011-08-06 LAB — COMPREHENSIVE METABOLIC PANEL
ALT: 60 U/L — ABNORMAL HIGH (ref 0–53)
AST: 41 U/L — ABNORMAL HIGH (ref 0–37)
Albumin: 4 g/dL (ref 3.5–5.2)
Alkaline Phosphatase: 58 U/L (ref 39–117)
Chloride: 104 mEq/L (ref 96–112)
Potassium: 3.8 mEq/L (ref 3.5–5.3)
Sodium: 143 mEq/L (ref 135–145)
Total Protein: 5.9 g/dL — ABNORMAL LOW (ref 6.0–8.3)

## 2011-08-06 LAB — CBC WITH DIFFERENTIAL/PLATELET
BASO%: 0 % (ref 0.0–2.0)
EOS%: 1.1 % (ref 0.0–7.0)
MCH: 33.4 pg (ref 27.2–33.4)
MCV: 94.6 fL (ref 79.3–98.0)
MONO%: 26.5 % — ABNORMAL HIGH (ref 0.0–14.0)
NEUT#: 2 10*3/uL (ref 1.5–6.5)
RBC: 3.68 10*6/uL — ABNORMAL LOW (ref 4.20–5.82)
RDW: 18.2 % — ABNORMAL HIGH (ref 11.0–14.6)
lymph#: 0.6 10*3/uL — ABNORMAL LOW (ref 0.9–3.3)

## 2011-08-07 ENCOUNTER — Encounter (HOSPITAL_BASED_OUTPATIENT_CLINIC_OR_DEPARTMENT_OTHER): Payer: BC Managed Care – PPO | Admitting: Hematology and Oncology

## 2011-08-07 DIAGNOSIS — Z5111 Encounter for antineoplastic chemotherapy: Secondary | ICD-10-CM

## 2011-08-07 DIAGNOSIS — C20 Malignant neoplasm of rectum: Secondary | ICD-10-CM

## 2011-08-27 ENCOUNTER — Other Ambulatory Visit: Payer: Self-pay | Admitting: Hematology and Oncology

## 2011-08-27 ENCOUNTER — Encounter (HOSPITAL_BASED_OUTPATIENT_CLINIC_OR_DEPARTMENT_OTHER): Payer: BC Managed Care – PPO | Admitting: Hematology and Oncology

## 2011-08-27 DIAGNOSIS — C19 Malignant neoplasm of rectosigmoid junction: Secondary | ICD-10-CM

## 2011-08-27 DIAGNOSIS — C20 Malignant neoplasm of rectum: Secondary | ICD-10-CM

## 2011-08-27 DIAGNOSIS — Z452 Encounter for adjustment and management of vascular access device: Secondary | ICD-10-CM

## 2011-08-27 DIAGNOSIS — Z5111 Encounter for antineoplastic chemotherapy: Secondary | ICD-10-CM

## 2011-08-27 LAB — CBC WITH DIFFERENTIAL/PLATELET
BASO%: 0.3 % (ref 0.0–2.0)
Basophils Absolute: 0 10*3/uL (ref 0.0–0.1)
EOS%: 2.1 % (ref 0.0–7.0)
HGB: 13.2 g/dL (ref 13.0–17.1)
MCH: 34.1 pg — ABNORMAL HIGH (ref 27.2–33.4)
MCHC: 35.5 g/dL (ref 32.0–36.0)
MCV: 96.1 fL (ref 79.3–98.0)
MONO%: 17.3 % — ABNORMAL HIGH (ref 0.0–14.0)
RBC: 3.87 10*6/uL — ABNORMAL LOW (ref 4.20–5.82)
RDW: 17.9 % — ABNORMAL HIGH (ref 11.0–14.6)

## 2011-08-27 LAB — COMPREHENSIVE METABOLIC PANEL
AST: 39 U/L — ABNORMAL HIGH (ref 0–37)
Albumin: 4.2 g/dL (ref 3.5–5.2)
Alkaline Phosphatase: 62 U/L (ref 39–117)
BUN: 17 mg/dL (ref 6–23)
Potassium: 3.9 mEq/L (ref 3.5–5.3)

## 2011-08-28 ENCOUNTER — Encounter (HOSPITAL_BASED_OUTPATIENT_CLINIC_OR_DEPARTMENT_OTHER): Payer: BC Managed Care – PPO | Admitting: Hematology and Oncology

## 2011-08-28 DIAGNOSIS — C20 Malignant neoplasm of rectum: Secondary | ICD-10-CM

## 2011-08-28 DIAGNOSIS — Z5111 Encounter for antineoplastic chemotherapy: Secondary | ICD-10-CM

## 2011-08-29 ENCOUNTER — Ambulatory Visit
Admission: RE | Admit: 2011-08-29 | Discharge: 2011-08-29 | Disposition: A | Discharge status: Home / Self Care | Payer: BC Managed Care – PPO | Source: Ambulatory Visit | Attending: Radiation Oncology | Admitting: Radiation Oncology

## 2011-09-19 ENCOUNTER — Ambulatory Visit (INDEPENDENT_AMBULATORY_CARE_PROVIDER_SITE_OTHER): Payer: BC Managed Care – PPO | Admitting: Surgery

## 2011-09-19 ENCOUNTER — Encounter (INDEPENDENT_AMBULATORY_CARE_PROVIDER_SITE_OTHER): Payer: Self-pay | Admitting: Surgery

## 2011-09-19 VITALS — BP 144/98 | HR 76 | Temp 98.3°F | Resp 16 | Ht 72.0 in | Wt 198.6 lb

## 2011-09-19 DIAGNOSIS — Z85048 Personal history of other malignant neoplasm of rectum, rectosigmoid junction, and anus: Secondary | ICD-10-CM

## 2011-09-19 NOTE — Patient Instructions (Signed)
Follow up in 6 months 

## 2011-09-19 NOTE — Progress Notes (Signed)
Subjective:     Patient ID: Devon Bray, male   DOB: Nov 18, 1966, 45 y.o.   MRN: [id number]  HPI The patient presents in followup for rectal cancer. Via laparoscopic low anterior resection done in January of 2012 for a stage III rectal cancer. He has completed chemotherapy. He is doing well. He has gained weight. He had a thrombosed external hemorrhoid removed earlier this year and has done well. He denies any abdominal pain.   Review of Systems  Constitutional: Negative.   HENT: Negative.   Eyes: Negative.   Respiratory: Negative.   Gastrointestinal: Negative for abdominal pain and anal bleeding.  Genitourinary: Negative.        Objective:   Physical Exam  Constitutional: He appears well-developed and well-nourished.  HENT:  Head: Normocephalic and atraumatic.  Nose: Nose normal.  Eyes: EOM are normal. Pupils are equal, round, and reactive to light.  Neck: Normal range of motion.  Pulmonary/Chest:       Right subclavian Port-A-Cath site clean  Abdominal: Soft. He exhibits no distension. There is no tenderness.       Lower midline scar well healed.  Genitourinary: Rectum normal.       Assessment:     Stage III rectal cancer date of surgery January 2012 low anterior resection  External hemorrhoids    Plan:     Followup in 6 months. Continue followup with medical oncology. I told him we can remove his port sometime early next year.

## 2011-10-21 ENCOUNTER — Encounter: Payer: Self-pay | Admitting: *Deleted

## 2011-10-22 ENCOUNTER — Ambulatory Visit (HOSPITAL_BASED_OUTPATIENT_CLINIC_OR_DEPARTMENT_OTHER): Payer: BC Managed Care – PPO

## 2011-10-22 DIAGNOSIS — C20 Malignant neoplasm of rectum: Secondary | ICD-10-CM

## 2011-10-22 DIAGNOSIS — Z452 Encounter for adjustment and management of vascular access device: Secondary | ICD-10-CM

## 2011-10-22 MED ORDER — SODIUM CHLORIDE 0.9 % IJ SOLN
10.0000 mL | Freq: Once | INTRAMUSCULAR | Status: AC
  Administered 2011-10-22: 10 mL via INTRAVENOUS
  Filled 2011-10-22: qty 10

## 2011-10-22 MED ORDER — HEPARIN SOD (PORK) LOCK FLUSH 100 UNIT/ML IV SOLN
500.0000 [IU] | Freq: Once | INTRAVENOUS | Status: AC
  Administered 2011-10-22: 500 [IU] via INTRAVENOUS
  Filled 2011-10-22: qty 5

## 2011-10-22 NOTE — Patient Instructions (Signed)
Call MD for problems 

## 2011-11-03 ENCOUNTER — Encounter: Payer: Self-pay | Admitting: *Deleted

## 2011-11-04 ENCOUNTER — Other Ambulatory Visit: Payer: Self-pay | Admitting: Hematology and Oncology

## 2011-11-04 ENCOUNTER — Ambulatory Visit (HOSPITAL_COMMUNITY)
Admission: RE | Admit: 2011-11-04 | Discharge: 2011-11-04 | Disposition: A | Discharge status: Home / Self Care | Payer: BC Managed Care – PPO | Source: Ambulatory Visit | Attending: Hematology and Oncology | Admitting: Hematology and Oncology

## 2011-11-04 ENCOUNTER — Other Ambulatory Visit (HOSPITAL_BASED_OUTPATIENT_CLINIC_OR_DEPARTMENT_OTHER): Payer: BC Managed Care – PPO | Admitting: Lab

## 2011-11-04 DIAGNOSIS — R0602 Shortness of breath: Secondary | ICD-10-CM | POA: Insufficient documentation

## 2011-11-04 DIAGNOSIS — C20 Malignant neoplasm of rectum: Secondary | ICD-10-CM

## 2011-11-04 DIAGNOSIS — Z9049 Acquired absence of other specified parts of digestive tract: Secondary | ICD-10-CM | POA: Insufficient documentation

## 2011-11-04 DIAGNOSIS — C19 Malignant neoplasm of rectosigmoid junction: Secondary | ICD-10-CM | POA: Insufficient documentation

## 2011-11-04 DIAGNOSIS — Z5111 Encounter for antineoplastic chemotherapy: Secondary | ICD-10-CM

## 2011-11-04 LAB — CMP (CANCER CENTER ONLY)
AST: 27 U/L (ref 11–38)
Albumin: 3.8 g/dL (ref 3.3–5.5)
Alkaline Phosphatase: 73 U/L (ref 26–84)
Calcium: 8.4 mg/dL (ref 8.0–10.3)
Chloride: 100 mEq/L (ref 98–108)
Glucose, Bld: 94 mg/dL (ref 73–118)
Potassium: 4.4 mEq/L (ref 3.3–4.7)
Sodium: 142 mEq/L (ref 128–145)
Total Protein: 7.1 g/dL (ref 6.4–8.1)

## 2011-11-04 LAB — CBC WITH DIFFERENTIAL/PLATELET
Basophils Absolute: 0 10*3/uL (ref 0.0–0.1)
Eosinophils Absolute: 0.1 10*3/uL (ref 0.0–0.5)
HGB: 14.6 g/dL (ref 13.0–17.1)
MCV: 98.6 fL — ABNORMAL HIGH (ref 79.3–98.0)
MONO%: 14 % (ref 0.0–14.0)
NEUT#: 2.8 10*3/uL (ref 1.5–6.5)
RBC: 4.31 10*6/uL (ref 4.20–5.82)
RDW: 13.3 % (ref 11.0–14.6)
WBC: 4.3 10*3/uL (ref 4.0–10.3)
lymph#: 0.9 10*3/uL (ref 0.9–3.3)

## 2011-11-04 LAB — CEA: CEA: 1.9 ng/mL (ref 0.0–5.0)

## 2011-11-04 MED ORDER — IOHEXOL 300 MG/ML  SOLN: 100.0000 mL | Freq: Once | INTRAMUSCULAR | Status: AC | PRN

## 2011-11-07 ENCOUNTER — Ambulatory Visit (HOSPITAL_BASED_OUTPATIENT_CLINIC_OR_DEPARTMENT_OTHER): Payer: BC Managed Care – PPO | Admitting: Hematology and Oncology

## 2011-11-07 ENCOUNTER — Telehealth: Payer: Self-pay | Admitting: Hematology and Oncology

## 2011-11-07 VITALS — BP 133/82 | HR 66 | Temp 97.1°F | Ht 72.0 in | Wt 198.9 lb

## 2011-11-07 DIAGNOSIS — C20 Malignant neoplasm of rectum: Secondary | ICD-10-CM

## 2011-11-07 DIAGNOSIS — C189 Malignant neoplasm of colon, unspecified: Secondary | ICD-10-CM | POA: Insufficient documentation

## 2011-11-07 NOTE — Telephone Encounter (Signed)
Called [facility] to refer pt to Dr. Luisa Hart, I got a call from [facility] informing me Dr Dalene Carrow should send the order to Dr Luisa Hart. Order was in.

## 2011-11-07 NOTE — Telephone Encounter (Signed)
Gave pt calendar for March and April, lab and MD. Pt will be seen by Dr Madilyn Fireman, [facility] on 11/10/11. Called Dr Cornett's office,left message for OR scheduler. Pt wants an appt for next year, for a porta cath removal.

## 2011-11-07 NOTE — Progress Notes (Signed)
CC:   Devon Bray, M.D. Devon Bray, M.D. Devon C. Devon Bray, M.D.  IDENTIFYING STATEMENT:  The patient is a 45 year old man with colon cancer who presents for followup.  INTERIM HISTORY:  He completed chemotherapy on 08/28/2011.  Besides tingling in his feet he has no complaints.  His weight is stable.  He denies nausea, vomiting, or rectal bleeding.  I reviewed his CT scans dated 11/04/2011.  There are no lung nodules or airspace disease.  There was minimal subpleural left lower lobe subsegmental atelectasis.  The abdomen and pelvis showed no acute process or evidence of metastatic disease.  MEDICATIONS:  Reviewed and updated.  ALLERGIES:  Penicillin.  PAST MEDICAL HISTORY:  Unchanged.  FAMILY HISTORY:  Unchanged.  SOCIAL HISTORY:  Unchanged.  REVIEW OF SYSTEMS:  As above, and 10-point review of systems is negative.  PHYSICAL EXAMINATION:  Alert and oriented x3.  Vitals:  Pulse 66, blood pressure 133/82, temp 97.1, respirations 20, weight 198 pounds.  HEENT: Head is atraumatic, normocephalic.  Sclerae anicteric.  Mouth moist. Chest:  Clear.  CVS:  Unremarkable.  Port with no sign of infection. Abdomen:  Soft, nontender.  Bowel sounds are present.  Extremities:  No edema.  Lymph Nodes:  No adenopathy.  CNS:  Nonfocal.  LABORATORY DATA:  On 11/04/2011, white cell count 4.3, hemoglobin 14.6, hematocrit 42.5, platelets 200.  Sodium 142, potassium 4.4, chloride 100, CO2 of 30, BUN 16, creatinine 0.9, glucose 94, total bilirubin 0.7, alkaline phosphatase 73, AST 27, ALT 28, calcium 8.4.  CEA 1.9.  IMAGING DATA:  Results of the CT are as in the interval history.  IMPRESSION AND PLAN:  Mr. Devon Bray is a 45 year old man with a history of rectal cancer.  He is status post low anterior resection 12/10/2010 for a 4.4 cm moderately differentiated carcinoma with 1 of 14 positive lymph nodes.  He is status post pelvic external radiation therapy, and continuous 5-FU from  03/13/2011 through 03/10/2011.  He then went on to receive XELOX beginning 05/14/2011 through 08/28/2011.  His current blood work, exam, and scans indicate no evidence of recurrence.  Mr. Devon Bray will schedule a colonoscopy with Dr. Madilyn Bray and removal of his port with Dr. Luisa Hart.  He follows up in 2 months' time with labs.  I spent more than half the time reviewing the results and coordinating care.    ______________________________ Laurice Record, M.D. LIO/MEDQ  D:  11/07/2011  T:  11/07/2011  Job:  [id number]

## 2011-11-07 NOTE — Progress Notes (Signed)
This office note has been dictated.

## 2011-11-08 ENCOUNTER — Other Ambulatory Visit: Payer: Self-pay | Admitting: Hematology and Oncology

## 2011-11-08 DIAGNOSIS — C189 Malignant neoplasm of colon, unspecified: Secondary | ICD-10-CM

## 2011-11-09 ENCOUNTER — Other Ambulatory Visit: Payer: Self-pay | Admitting: Hematology and Oncology

## 2011-11-09 ENCOUNTER — Other Ambulatory Visit (INDEPENDENT_AMBULATORY_CARE_PROVIDER_SITE_OTHER): Payer: Self-pay | Admitting: Surgery

## 2011-11-10 ENCOUNTER — Telehealth: Payer: Self-pay | Admitting: Hematology and Oncology

## 2011-11-10 NOTE — Telephone Encounter (Signed)
Talked to Janice @[facility] , order is in for porta cath removal. They will call pt

## 2011-12-02 HISTORY — PX: HERNIA REPAIR: SHX51

## 2011-12-04 ENCOUNTER — Encounter (INDEPENDENT_AMBULATORY_CARE_PROVIDER_SITE_OTHER): Payer: Self-pay | Admitting: Surgery

## 2011-12-04 ENCOUNTER — Ambulatory Visit (INDEPENDENT_AMBULATORY_CARE_PROVIDER_SITE_OTHER): Payer: BC Managed Care – PPO | Admitting: Surgery

## 2011-12-04 VITALS — BP 145/98 | HR 69 | Temp 98.7°F | Ht 72.0 in | Wt 197.2 lb

## 2011-12-04 DIAGNOSIS — Z85048 Personal history of other malignant neoplasm of rectum, rectosigmoid junction, and anus: Secondary | ICD-10-CM

## 2011-12-04 NOTE — Progress Notes (Signed)
Patient ID: Devon Bray, male   DOB: [birth year], 46 y.o.   MRN: [id number]  Chief Complaint  Patient presents with  . Follow-up    reck P A C discuss removal    HPI Devon Bray is a 46 y.o. male.   HPI The patient returns in follow up due to history of rectal cancer. He is status post a laparoscopic low anterior resection one year ago. He completed chemotherapy in September of this year. He is doing well. He denies any rectal pain, rectal bleeding or abdominal pain. He is here to have his port removed. Past Medical History  Diagnosis Date  . Rectal cancer     rectal ca dx 10/2010    Past Surgical History  Procedure Date  . Rectal surgery   . Insert port a cath     Family History  Problem Relation Age of Onset  . Cancer Father     prostate  . Cancer Paternal Uncle     colon  . Lymphoma Maternal Aunt   . Lung cancer Maternal Grandmother     Social History History  Substance Use Topics  . Smoking status: Never Smoker   . Smokeless tobacco: Not on file  . Alcohol Use: No    Allergies  Allergen Reactions  . Penicillins     No current outpatient prescriptions on file.    Review of Systems Review of Systems  Constitutional: Negative for fever, chills and unexpected weight change.  HENT: Negative for hearing loss, congestion, sore throat, trouble swallowing and voice change.   Eyes: Negative for visual disturbance.  Respiratory: Negative for cough and wheezing.   Cardiovascular: Negative for chest pain, palpitations and leg swelling.  Gastrointestinal: Negative for nausea, vomiting, abdominal pain, diarrhea, constipation, blood in stool, abdominal distention, anal bleeding and rectal pain.  Genitourinary: Negative for hematuria and difficulty urinating.  Musculoskeletal: Negative for arthralgias.  Skin: Negative for rash and wound.  Neurological: Negative for seizures, syncope, weakness and headaches.  Hematological: Negative for adenopathy. Does not  bruise/bleed easily.  Psychiatric/Behavioral: Negative for confusion.    Blood pressure 145/98, pulse 69, temperature 98.7 F (37.1 C), temperature source Temporal, height 6' (1.829 m), weight 197 lb 3.2 oz (89.449 kg), SpO2 97.00%.  Physical Exam Physical Exam  Constitutional: He is oriented to person, place, and time. He appears well-developed and well-nourished.  HENT:  Head: Normocephalic and atraumatic.  Eyes: EOM are normal. Pupils are equal, round, and reactive to light.  Neck: Normal range of motion. Neck supple.  Cardiovascular: Normal rate and regular rhythm.   Pulmonary/Chest:       Port right subclavian region  Neurological: He is alert and oriented to person, place, and time.  Skin: Skin is warm.  Psychiatric: He has a normal mood and affect. His behavior is normal. Judgment and thought content normal.    Data Reviewed   Assessment    History of rectal cancer and port a cath    Plan    Port removal.  The procedure has been discussed with the patient.  Alternative therapies have been discussed with the patient.  Operative risks include bleeding,  Infection,  Organ injury,  Nerve injury,  Blood vessel injury,  DVT,  Pulmonary embolism,  Death,  And possible reoperation.  Medical management risks include worsening of present situation.   The patient understands and agrees to proceed.       Jaria Conway A. 12/04/2011, 2:31 PM

## 2011-12-04 NOTE — Patient Instructions (Signed)
You will be scheduled for port removal.

## 2011-12-09 ENCOUNTER — Encounter (HOSPITAL_COMMUNITY): Payer: Self-pay | Admitting: Pharmacy Technician

## 2011-12-15 ENCOUNTER — Encounter (HOSPITAL_COMMUNITY)
Admission: RE | Admit: 2011-12-15 | Discharge: 2011-12-15 | Disposition: A | Discharge status: Home / Self Care | Payer: BC Managed Care – PPO | Source: Ambulatory Visit | Attending: Surgery | Admitting: Surgery

## 2011-12-15 ENCOUNTER — Encounter (HOSPITAL_COMMUNITY): Payer: Self-pay

## 2011-12-15 LAB — SURGICAL PCR SCREEN: Staphylococcus aureus: NEGATIVE

## 2011-12-15 NOTE — Patient Instructions (Addendum)
[name]  12/15/2011   Your procedure is scheduled on:  12/22/11  Report to [facility] at 5:30 AM.  Call this number if you have problems the morning of surgery: [phone]   Remember:   Do not eat food:After Midnight.  May have clear liquids:until Midnight .  Clear liquids include soda, tea, black coffee, apple or grape juice, broth.  Take these medicines the morning of surgery with A SIP OF WATER:    Do not wear jewelry, make-up or nail polish.  Do not wear lotions, powders, or perfumes.    Do not bring valuables to the hospital.  Contacts, dentures or bridgework may not be worn into surgery.  Leave suitcase in the car. After surgery it may be brought to your room.  For patients admitted to the hospital, checkout time is 11:00 AM the day of discharge.   Patients discharged the day of surgery will not be allowed to drive home.  Name and phone number of your driver:   Special Instructions: CHG Shower Use Special Wash: 1/2 bottle night before surgery and 1/2 bottle morning of surgery.   Please read over the following fact sheets that you were given: MRSA Information

## 2011-12-19 ENCOUNTER — Telehealth: Payer: Self-pay | Admitting: *Deleted

## 2011-12-19 NOTE — Telephone Encounter (Signed)
Spoke with pt and informed pt to notify his GI  Dr. Madilyn Fireman as per md's instructions.    Pt voiced understanding.

## 2011-12-19 NOTE — Telephone Encounter (Signed)
Received call from pt re:  Pt has had 2 episodes of bms  Pattern  -  Going  Normal bms  2-3 times a day for  About 3 -4 days, then  By the fifth day, pt experiences diarrhea.   Pt stated he had one pattern like this before colonoscopy done last week.    Pt wanted to know if he should be concerned.    Instructed pt to also notify his GI  Dr.  Madilyn Fireman of above issue. Pt's   Phone       [phone].

## 2011-12-22 ENCOUNTER — Ambulatory Visit (HOSPITAL_COMMUNITY): Payer: BC Managed Care – PPO | Admitting: Anesthesiology

## 2011-12-22 ENCOUNTER — Encounter (HOSPITAL_COMMUNITY)
Admission: RE | Disposition: A | Discharge status: Home / Self Care | Payer: Self-pay | Source: Ambulatory Visit | Attending: Surgery

## 2011-12-22 ENCOUNTER — Encounter (HOSPITAL_COMMUNITY): Payer: Self-pay | Admitting: Anesthesiology

## 2011-12-22 ENCOUNTER — Encounter (HOSPITAL_COMMUNITY): Payer: Self-pay | Admitting: Surgery

## 2011-12-22 ENCOUNTER — Ambulatory Visit (HOSPITAL_COMMUNITY)
Admission: RE | Admit: 2011-12-22 | Discharge: 2011-12-22 | Disposition: A | Discharge status: Home / Self Care | Payer: BC Managed Care – PPO | Source: Ambulatory Visit | Attending: Surgery | Admitting: Surgery

## 2011-12-22 ENCOUNTER — Encounter (HOSPITAL_COMMUNITY): Payer: Self-pay | Admitting: *Deleted

## 2011-12-22 DIAGNOSIS — Z452 Encounter for adjustment and management of vascular access device: Secondary | ICD-10-CM

## 2011-12-22 DIAGNOSIS — Z01812 Encounter for preprocedural laboratory examination: Secondary | ICD-10-CM | POA: Insufficient documentation

## 2011-12-22 DIAGNOSIS — C20 Malignant neoplasm of rectum: Secondary | ICD-10-CM | POA: Insufficient documentation

## 2011-12-22 DIAGNOSIS — C189 Malignant neoplasm of colon, unspecified: Secondary | ICD-10-CM

## 2011-12-22 HISTORY — PX: PORT-A-CATH REMOVAL: SHX5289

## 2011-12-22 SURGERY — REMOVAL PORT-A-CATH
Anesthesia: Monitor Anesthesia Care | Wound class: Clean

## 2011-12-22 MED ORDER — HYDROMORPHONE HCL PF 1 MG/ML IJ SOLN: 0.2500 mg | INTRAMUSCULAR | Status: DC | PRN

## 2011-12-22 MED ORDER — LACTATED RINGERS IV SOLN: INTRAVENOUS | Status: DC

## 2011-12-22 MED ORDER — 0.9 % SODIUM CHLORIDE (POUR BTL) OPTIME
TOPICAL | Status: DC | PRN
  Administered 2011-12-22: 1000 mL

## 2011-12-22 MED ORDER — CIPROFLOXACIN IN D5W 400 MG/200ML IV SOLN
INTRAVENOUS | Status: AC
  Filled 2011-12-22: qty 200

## 2011-12-22 MED ORDER — MIDAZOLAM HCL 5 MG/5ML IJ SOLN
INTRAMUSCULAR | Status: DC | PRN
  Administered 2011-12-22: 2 mg via INTRAVENOUS

## 2011-12-22 MED ORDER — CIPROFLOXACIN IN D5W 400 MG/200ML IV SOLN
400.0000 mg | INTRAVENOUS | Status: AC
  Administered 2011-12-22: 400 mg via INTRAVENOUS
  Filled 2011-12-22: qty 200

## 2011-12-22 MED ORDER — PROMETHAZINE HCL 25 MG/ML IJ SOLN: 6.2500 mg | INTRAMUSCULAR | Status: DC | PRN

## 2011-12-22 MED ORDER — HYDROCODONE-ACETAMINOPHEN 5-500 MG PO TABS: 1.0000 | ORAL_TABLET | Freq: Four times a day (QID) | ORAL | Status: AC | PRN

## 2011-12-22 MED ORDER — BUPIVACAINE HCL (PF) 0.5 % IJ SOLN
INTRAMUSCULAR | Status: AC
  Filled 2011-12-22: qty 30

## 2011-12-22 MED ORDER — BUPIVACAINE HCL (PF) 0.5 % IJ SOLN
INTRAMUSCULAR | Status: DC | PRN
  Administered 2011-12-22: 20 mL

## 2011-12-22 MED ORDER — PROPOFOL 10 MG/ML IV EMUL
INTRAVENOUS | Status: DC | PRN
  Administered 2011-12-22: 125 ug/kg/min via INTRAVENOUS

## 2011-12-22 MED ORDER — LACTATED RINGERS IV SOLN
INTRAVENOUS | Status: DC | PRN
  Administered 2011-12-22: 07:00:00 via INTRAVENOUS

## 2011-12-22 MED ORDER — FENTANYL CITRATE 0.05 MG/ML IJ SOLN
INTRAMUSCULAR | Status: DC | PRN
  Administered 2011-12-22 (×4): 25 ug via INTRAVENOUS

## 2011-12-22 MED ORDER — LIDOCAINE HCL (CARDIAC) 20 MG/ML IV SOLN
INTRAVENOUS | Status: DC | PRN
  Administered 2011-12-22: 50 mg via INTRAVENOUS

## 2011-12-22 SURGICAL SUPPLY — 36 items
BLADE HEX COATED 2.75 (ELECTRODE) ×2 IMPLANT
BLADE SURG 15 STRL LF DISP TIS (BLADE) ×2 IMPLANT
BLADE SURG 15 STRL SS (BLADE) ×2
BLADE SURG SZ10 CARB STEEL (BLADE) ×2 IMPLANT
CANISTER SUCTION 2500CC (MISCELLANEOUS) ×2 IMPLANT
CLOTH BEACON ORANGE TIMEOUT ST (SAFETY) ×2 IMPLANT
COVER SURGICAL LIGHT HANDLE (MISCELLANEOUS) ×2 IMPLANT
DECANTER SPIKE VIAL GLASS SM (MISCELLANEOUS) IMPLANT
DERMABOND ADVANCED (GAUZE/BANDAGES/DRESSINGS) ×1
DERMABOND ADVANCED .7 DNX12 (GAUZE/BANDAGES/DRESSINGS) ×1 IMPLANT
DRAPE LAPAROTOMY TRNSV 102X78 (DRAPE) ×2 IMPLANT
ELECT REM PT RETURN 9FT ADLT (ELECTROSURGICAL) ×2
ELECTRODE REM PT RTRN 9FT ADLT (ELECTROSURGICAL) ×1 IMPLANT
GAUZE SPONGE 4X4 16PLY XRAY LF (GAUZE/BANDAGES/DRESSINGS) ×2 IMPLANT
GLOVE BIOGEL PI IND STRL 7.0 (GLOVE) ×1 IMPLANT
GLOVE BIOGEL PI INDICATOR 7.0 (GLOVE) ×1
GLOVE INDICATOR 8.0 STRL GRN (GLOVE) ×4 IMPLANT
GLOVE SS BIOGEL STRL SZ 8 (GLOVE) ×1 IMPLANT
GLOVE SUPERSENSE BIOGEL SZ 8 (GLOVE) ×1
GOWN STRL NON-REIN LRG LVL3 (GOWN DISPOSABLE) ×2 IMPLANT
GOWN STRL REIN XL XLG (GOWN DISPOSABLE) ×4 IMPLANT
KIT BASIN OR (CUSTOM PROCEDURE TRAY) ×2 IMPLANT
NEEDLE HYPO 22GX1.5 SAFETY (NEEDLE) ×2 IMPLANT
NEEDLE HYPO 25X1 1.5 SAFETY (NEEDLE) IMPLANT
NS IRRIG 1000ML POUR BTL (IV SOLUTION) ×2 IMPLANT
PACK BASIC VI WITH GOWN DISP (CUSTOM PROCEDURE TRAY) ×2 IMPLANT
PEN SKIN MARKING BROAD (MISCELLANEOUS) ×2 IMPLANT
PENCIL BUTTON HOLSTER BLD 10FT (ELECTRODE) ×2 IMPLANT
SOL PREP POV-IOD 16OZ 10% (MISCELLANEOUS) ×2 IMPLANT
SPONGE GAUZE 4X4 12PLY (GAUZE/BANDAGES/DRESSINGS) ×2 IMPLANT
SUT MNCRL AB 4-0 PS2 18 (SUTURE) ×2 IMPLANT
SUT VIC AB 3-0 SH 27 (SUTURE) ×1
SUT VIC AB 3-0 SH 27X BRD (SUTURE) ×1 IMPLANT
SYR CONTROL 10ML LL (SYRINGE) ×2 IMPLANT
TOWEL OR 17X26 10 PK STRL BLUE (TOWEL DISPOSABLE) ×2 IMPLANT
YANKAUER SUCT BULB TIP 10FT TU (MISCELLANEOUS) ×2 IMPLANT

## 2011-12-22 NOTE — Op Note (Signed)
Preop diagnosis: Indwelling port a catheter for chemotherapy  Postop diagnosis: Same  Procedure: Removal of port a catheter  Surgeon: Harriette Bouillon M.D.  Anesthesia: MAC with local  EBL: Minimal  Specimen none  Drains: None  Indications for procedure: The patient presents for removal of port a catheter after completing chemotherapy. The patient no longer requires central venous access. Risks of bleeding, infection, catheter fragmentation, embolization, arrhythmias and damage to arteries, veins and nerves and possibly other mediastinal structures discussed. The patient agrees to proceed.  Description of procedure: The patient was seen in the holding area. Questions were answered. The patient agreed to proceed. The patient was taken to the operating room. The patient was placed supine. Anesthesia was initiated. The skin on the upper chest was prepped and draped in a sterile fashion. Timeout was done. The patient received preoperative antibiotics.  0.5% bupivicaine was used for local. Incision was made through the old port site and the hub of the Port-A-Cath was seen. The sutures were cut to release the port from the chest wall. The catheter was removed in its entirety without difficulty. The tract was closed with 3-0 Vicryl. 4 Monocryl was used to close the skin. All final counts were correct. The patient was taken to recovery in satisfactory condition.

## 2011-12-22 NOTE — Transfer of Care (Signed)
Immediate Anesthesia Transfer of Care Note  Patient: Devon Bray  Procedure(s) Performed:  REMOVAL PORT-A-CATH  Patient Location: PACU  Anesthesia Type: MAC  Level of Consciousness: sedated, patient cooperative and responds to stimulaton  Airway & Oxygen Therapy: Patient Spontanous Breathing and Patient connected to face mask oxgen  Post-op Assessment: Report given to PACU RN and Post -op Vital signs reviewed and stable  Post vital signs: Reviewed and stable  Complications: No apparent anesthesia complications

## 2011-12-22 NOTE — Anesthesia Postprocedure Evaluation (Signed)
  Anesthesia Post-op Note  Patient: Devon Bray  Procedure(s) Performed:  REMOVAL PORT-A-CATH  Patient Location: PACU  Anesthesia Type: MAC  Level of Consciousness: oriented and sedated  Airway and Oxygen Therapy: Patient Spontanous Breathing  Post-op Pain: none  Post-op Assessment: Post-op Vital signs reviewed, Patient's Cardiovascular Status Stable, Respiratory Function Stable and Patent Airway  Post-op Vital Signs: stable  Complications: No apparent anesthesia complications

## 2011-12-22 NOTE — H&P (View-Only) (Signed)
Patient ID: Devon Bray, male   DOB: 12/12/1965, 46 y.o.   MRN: [id number]  Chief Complaint  Patient presents with  . Follow-up    reck P A C discuss removal    HPI Devon Bray is a 46 y.o. male.   HPI The patient returns in follow up due to history of rectal cancer. He is status post a laparoscopic low anterior resection one year ago. He completed chemotherapy in September of this year. He is doing well. He denies any rectal pain, rectal bleeding or abdominal pain. He is here to have his port removed. Past Medical History  Diagnosis Date  . Rectal cancer     rectal ca dx 10/2010    Past Surgical History  Procedure Date  . Rectal surgery   . Insert port a cath     Family History  Problem Relation Age of Onset  . Cancer Father     prostate  . Cancer Paternal Uncle     colon  . Lymphoma Maternal Aunt   . Lung cancer Maternal Grandmother     Social History History  Substance Use Topics  . Smoking status: Never Smoker   . Smokeless tobacco: Not on file  . Alcohol Use: No    Allergies  Allergen Reactions  . Penicillins     No current outpatient prescriptions on file.    Review of Systems Review of Systems  Constitutional: Negative for fever, chills and unexpected weight change.  HENT: Negative for hearing loss, congestion, sore throat, trouble swallowing and voice change.   Eyes: Negative for visual disturbance.  Respiratory: Negative for cough and wheezing.   Cardiovascular: Negative for chest pain, palpitations and leg swelling.  Gastrointestinal: Negative for nausea, vomiting, abdominal pain, diarrhea, constipation, blood in stool, abdominal distention, anal bleeding and rectal pain.  Genitourinary: Negative for hematuria and difficulty urinating.  Musculoskeletal: Negative for arthralgias.  Skin: Negative for rash and wound.  Neurological: Negative for seizures, syncope, weakness and headaches.  Hematological: Negative for adenopathy. Does not  bruise/bleed easily.  Psychiatric/Behavioral: Negative for confusion.    Blood pressure 145/98, pulse 69, temperature 98.7 F (37.1 C), temperature source Temporal, height 6' (1.829 m), weight 197 lb 3.2 oz (89.449 kg), SpO2 97.00%.  Physical Exam Physical Exam  Constitutional: He is oriented to person, place, and time. He appears well-developed and well-nourished.  HENT:  Head: Normocephalic and atraumatic.  Eyes: EOM are normal. Pupils are equal, round, and reactive to light.  Neck: Normal range of motion. Neck supple.  Cardiovascular: Normal rate and regular rhythm.   Pulmonary/Chest:       Port right subclavian region  Neurological: He is alert and oriented to person, place, and time.  Skin: Skin is warm.  Psychiatric: He has a normal mood and affect. His behavior is normal. Judgment and thought content normal.    Data Reviewed   Assessment    History of rectal cancer and port a cath    Plan    Port removal.  The procedure has been discussed with the patient.  Alternative therapies have been discussed with the patient.  Operative risks include bleeding,  Infection,  Organ injury,  Nerve injury,  Blood vessel injury,  DVT,  Pulmonary embolism,  Death,  And possible reoperation.  Medical management risks include worsening of present situation.   The patient understands and agrees to proceed.       Jalie Eiland A. 12/04/2011, 2:31 PM    

## 2011-12-22 NOTE — Interval H&P Note (Signed)
History and Physical Interval Note:  12/22/2011 7:26 AM  Devon Bray  has presented today for surgery, with the diagnosis of colon cancer  The various methods of treatment have been discussed with the patient and family. After consideration of risks, benefits and other options for treatment, the patient has consented to  Procedure(s): REMOVAL PORT-A-CATH as a surgical intervention .  The patients' history has been reviewed, patient examined, no change in status, stable for surgery.  I have reviewed the patients' chart and labs.  Questions were answered to the patient's satisfaction.     Russel Morain A.

## 2011-12-22 NOTE — Anesthesia Preprocedure Evaluation (Signed)
Anesthesia Evaluation  Patient identified by MRN, date of birth, ID band Patient awake    Reviewed: Allergy & Precautions, H&P , NPO status , Patient's Chart, lab work & pertinent test results, reviewed documented beta blocker date and time   Airway Mallampati: II TM Distance: >3 FB Neck ROM: Full    Dental  (+) Teeth Intact   Pulmonary neg pulmonary ROS,  clear to auscultation        Cardiovascular neg cardio ROS Regular Normal Denies cardiac symptoms   Neuro/Psych Negative Neurological ROS  Negative Psych ROS   GI/Hepatic Neg liver ROS, Hx colon cancer   Endo/Other  Negative Endocrine ROS  Renal/GU negative Renal ROS  Genitourinary negative   Musculoskeletal negative musculoskeletal ROS (+)   Abdominal   Peds negative pediatric ROS (+)  Hematology negative hematology ROS (+)   Anesthesia Other Findings   Reproductive/Obstetrics negative OB ROS                           Anesthesia Physical Anesthesia Plan  ASA: II  Anesthesia Plan: MAC   Post-op Pain Management:    Induction: Intravenous  Airway Management Planned: Mask  Additional Equipment:   Intra-op Plan:   Post-operative Plan:   Informed Consent: I have reviewed the patients History and Physical, chart, labs and discussed the procedure including the risks, benefits and alternatives for the proposed anesthesia with the patient or authorized representative who has indicated his/her understanding and acceptance.     Plan Discussed with: CRNA and Surgeon  Anesthesia Plan Comments:         Anesthesia Quick Evaluation

## 2011-12-24 ENCOUNTER — Encounter (HOSPITAL_COMMUNITY): Payer: Self-pay | Admitting: Surgery

## 2011-12-24 ENCOUNTER — Telehealth (INDEPENDENT_AMBULATORY_CARE_PROVIDER_SITE_OTHER): Payer: Self-pay | Admitting: General Surgery

## 2011-12-24 NOTE — Telephone Encounter (Signed)
[facility] [employer] HAD A PORT-A-CATH CATH REMOVED RECENTLY AND WAS ASKING ABOUT EXERCISING/ WEIGHT LIFTING RESTRICTIONS/ REVIEWED WITH DR. Luisa Hart AND HE ADVISED THAT [facility] WAIT UNTIL Monday 12-29-11 TO RESUME WEIGHT LIFTING ROUTINE. ROUTINE DAILY ACTIVITIES ARE OK/ PT ADVISED/GY

## 2012-02-05 ENCOUNTER — Encounter: Payer: Self-pay | Admitting: *Deleted

## 2012-02-06 ENCOUNTER — Ambulatory Visit
Admission: RE | Admit: 2012-02-06 | Discharge: 2012-02-06 | Disposition: A | Discharge status: Home / Self Care | Payer: BC Managed Care – PPO | Source: Ambulatory Visit | Attending: Radiation Oncology | Admitting: Radiation Oncology

## 2012-02-06 ENCOUNTER — Encounter: Payer: Self-pay | Admitting: Radiation Oncology

## 2012-02-06 VITALS — BP 117/78 | HR 62 | Temp 97.4°F | Resp 20 | Ht 72.0 in | Wt 195.3 lb

## 2012-02-06 DIAGNOSIS — C20 Malignant neoplasm of rectum: Secondary | ICD-10-CM

## 2012-02-06 NOTE — Progress Notes (Signed)
[facility] Radiation Oncology Follow up Note  Name: Devon Bray   Date: 02/06/2012    MRN: [id number] DOB: 1966/04/08  CC:  Johny Blamer, MD, MD  Odogwu, Jamie Brookes, MD  DIAGNOSIS: rectal cancer     ALLERGIES: Penicillins   MEDICATIONS:  Current Outpatient Prescriptions  Medication Sig Dispense Refill  . acetaminophen (TYLENOL) 325 MG tablet Take by mouth.      . fish oil-omega-3 fatty acids 1000 MG capsule Take 2 g by mouth daily.      Marland Kitchen ibuprofen (ADVIL,MOTRIN) 200 MG tablet Take 400 mg by mouth every 6 (six) hours as needed. Pain        . Multiple Vitamin (MULTIVITAMIN) tablet Take 1 tablet by mouth daily.         NARRATIVE: The patient returns today for followup. He states that he is doing very well. He denies any ongoing diarrhea or nausea. He denies any problems with dysuria. He is working full time. His appetite is good. He is scheduled to see Dr. Dalene Carrow in April.   PHYSICAL EXAM:   height is 6' (1.829 m) and weight is 195 lb 4.8 oz (88.587 kg). His oral temperature is 97.4 F (36.3 C). His blood pressure is 117/78 and his pulse is 62. His respiration is 20.  General appearance: alert, cooperative and appears stated age Neck: Supple without lymphadenopathy. Cardiovascular: Regular rate and rhythm. Respiratory: Clear to auscultation bilaterally. GI: Soft, nontender, normal bowel sounds. Extremities: No edema present.   LABORATORY DATA:  Lab Results  Component Value Date   WBC 4.3 11/04/2011   HGB 14.6 11/04/2011   HCT 42.5 11/04/2011   MCV 98.6* 11/04/2011   PLT 200 11/04/2011   Lab Results  Component Value Date   NA 142 11/04/2011   K 4.4 11/04/2011   CL 100 11/04/2011   CO2 30 11/04/2011   Lab Results  Component Value Date   ALT 69* 08/27/2011   ALT 69* 08/27/2011   AST 27 11/04/2011   ALKPHOS 73 11/04/2011   BILITOT 0.70 11/04/2011       IMPRESSION: The patient is doing very well at this time and is continuing with observation. He has no  significant complaints today related to his treatment.    PLAN: The patient will return to our clinic on a period basis.   I spent 10 minutes minutes face to face with the patient and more than 50% of that time was spent in counseling and/or coordination of care.

## 2012-02-06 NOTE — Progress Notes (Signed)
Pt denies issues w/bowels, bladder. Appetite good, exercising, working full-time.

## 2012-02-27 ENCOUNTER — Other Ambulatory Visit (HOSPITAL_BASED_OUTPATIENT_CLINIC_OR_DEPARTMENT_OTHER): Payer: BC Managed Care – PPO | Admitting: Lab

## 2012-02-27 DIAGNOSIS — C189 Malignant neoplasm of colon, unspecified: Secondary | ICD-10-CM

## 2012-02-27 LAB — COMPREHENSIVE METABOLIC PANEL
ALT: 17 U/L (ref 0–53)
AST: 17 U/L (ref 0–37)
Albumin: 4.3 g/dL (ref 3.5–5.2)
Calcium: 9.3 mg/dL (ref 8.4–10.5)
Chloride: 104 mEq/L (ref 96–112)
Creatinine, Ser: 0.93 mg/dL (ref 0.50–1.35)
Potassium: 4.4 mEq/L (ref 3.5–5.3)

## 2012-02-27 LAB — CBC WITH DIFFERENTIAL/PLATELET
BASO%: 0.6 % (ref 0.0–2.0)
MCHC: 34.1 g/dL (ref 32.0–36.0)
MONO#: 0.7 10*3/uL (ref 0.1–0.9)
RBC: 4.46 10*6/uL (ref 4.20–5.82)
WBC: 5.8 10*3/uL (ref 4.0–10.3)
lymph#: 1.1 10*3/uL (ref 0.9–3.3)
nRBC: 0 % (ref 0–0)

## 2012-02-27 LAB — CEA: CEA: 2 ng/mL (ref 0.0–5.0)

## 2012-03-02 ENCOUNTER — Telehealth: Payer: Self-pay | Admitting: Hematology and Oncology

## 2012-03-02 ENCOUNTER — Encounter: Payer: Self-pay | Admitting: Hematology and Oncology

## 2012-03-02 ENCOUNTER — Ambulatory Visit (HOSPITAL_BASED_OUTPATIENT_CLINIC_OR_DEPARTMENT_OTHER): Payer: BC Managed Care – PPO | Admitting: Hematology and Oncology

## 2012-03-02 VITALS — BP 120/80 | HR 82 | Temp 97.0°F | Ht 72.0 in | Wt 195.2 lb

## 2012-03-02 DIAGNOSIS — Z85038 Personal history of other malignant neoplasm of large intestine: Secondary | ICD-10-CM

## 2012-03-02 DIAGNOSIS — C20 Malignant neoplasm of rectum: Secondary | ICD-10-CM

## 2012-03-02 NOTE — Patient Instructions (Signed)
Patient to follow up as instructed.   Current Outpatient Prescriptions  Medication Sig Dispense Refill  . acetaminophen (TYLENOL) 325 MG tablet Take by mouth.      . fish oil-omega-3 fatty acids 1000 MG capsule Take 2 g by mouth daily.      Marland Kitchen ibuprofen (ADVIL,MOTRIN) 200 MG tablet Take 400 mg by mouth every 6 (six) hours as needed. Pain        . Multiple Vitamin (MULTIVITAMIN) tablet Take 1 tablet by mouth daily.            April 2013  Sunday Monday Tuesday Wednesday Thursday Friday Saturday      1   2   EST PT 30   9:30 AM  (30 min.)  Laurice Record, MD  [facility] MEDICAL ONCOLOGY 3   4   5   6    7   8   9   10   11   12   13    14   15   16   17   18   19   20    21   22   23   24   25   26   27    28   29    30

## 2012-03-02 NOTE — Telephone Encounter (Signed)
Pt has the ct scan appts for sept

## 2012-03-02 NOTE — Progress Notes (Signed)
CC:   [name], M.D. [name], M.D. Thomas A. Cornett, M.D.  IDENTIFYING STATEMENT:  The patient is a 46 year old man with rectal cancer who presents for followup.  INTERVAL HISTORY:  Mr. Urizar was seen 4 months ago.  He has no current concerns.  His weight is stable.  He has no nausea, vomiting, or abdominal pain.  MEDICATIONS:  Reviewed and updated.  PAST MEDICAL HISTORY/FAMILY HISTORY/SOCIAL HISTORY:  Unchanged.  REVIEW OF SYSTEMS:  Ten point review of systems negative.  PHYSICAL EXAM:  The patient is a well-appearing, well-nourished man in no distress.  Vitals:  Pulse 82, blood pressure 120/80, temperature 97, respirations 20, weight 195 pounds.  HEENT:  Head is atraumatic, normocephalic.  Sclerae anicteric.  Mouth moist.  Neck:  Supple.  Chest: Clear.  Cardiovascular: Unremarkable.  Abdomen:  Soft, nontender.  Bowel sounds present.  Extremities:  No edema.  LAB DATA:  02/27/2012 white cell count 5.8, hemoglobin 13.8, hematocrit 40.6, platelets 264.  Sodium 141, potassium 4.4, chloride 104, CO2 28, BUN 19, creatinine 0.93, glucose 53, T. bilirubin 0.5, alkaline phosphatase 73, AST 17, ALT 17, calcium 9.3.  CEA 2.  IMPRESSION/PLAN:  Mr. Marques is a 46 year old man with history of rectal cancer.  He is status post low anterior resection on 12/10/2010 for 4.4 cm moderately differentiated carcinoma with 1 of 14 positive lymph nodes.  He is status post pelvic external radiation therapy and continuous 5-FU from 04/12/2011 through 03/10/2011.  He then went on to receive XELOX beginning 05/14/2011 through 08/28/2011.  He received a colonoscopy on 12/10/2011 which was felt to be unremarkable.  His exam indicates no evidence of recurrence.  Mr. Schrieber will be scheduled for followup visit in 4-6 months' time at which time he will receive a CT scan.    ______________________________ Laurice Record, M.D. LIO/MEDQ  D:  03/02/2012  T:  03/02/2012  Job:   [id number]

## 2012-03-02 NOTE — Telephone Encounter (Signed)
gve the pt his sept,oct 2013 appt calendars

## 2012-03-02 NOTE — Progress Notes (Signed)
This office note has been dictated.

## 2012-06-21 ENCOUNTER — Telehealth: Payer: Self-pay | Admitting: *Deleted

## 2012-06-21 NOTE — Telephone Encounter (Signed)
Patient called with c/o abdominal pain.  Pain is mild, intermittent, in epigastric region and has been occurring for the last 3 weeks.  Pain is exacerbated after eating meals.  Patient not taking any medication for indigestion.  Patient has also had 2 loose BMs in the last 2 days, and per patient this happens occasionally.  PAtient wanted to report symptoms, made aware that Dr. Dalene Carrow out of office, and to call is symptoms worsen.

## 2012-06-21 NOTE — Telephone Encounter (Signed)
Called patient to advise to see PCP regarding symptoms as documented earlier today by Lebron Quam, RN.  If need to be seen by oncologist, then PCP will contact our office.  Patient was concerned about possibility of recurrence, encouraged to discuss with PCP, as Dr. Dalene Carrow out of office.  RN cannot weigh in on this issue.  Patient verbalized understanding, and if unable to see PCP, will call our office.

## 2012-07-19 ENCOUNTER — Telehealth: Payer: Self-pay | Admitting: *Deleted

## 2012-07-19 NOTE — Telephone Encounter (Signed)
Patient called reporting that he is having an increase in indigestion/reflux symptoms, sometimes even having bloating/nausea. Informed patient that Dr Dalene Carrow is out of office today and he should contact his PCP. He states he has been to them, he was started on Nexium, (or something of the like) and takes that sometimes with relief, but noticed it to be worsening this weekend.  Suggested patient call PCP again, to be best determined if he should return to GI doc for possible upper endoscopy for further evaluation. Informed patient that if there were any concerns from PCP or GI, they would notify us for oncology needs.

## 2012-07-21 ENCOUNTER — Ambulatory Visit (INDEPENDENT_AMBULATORY_CARE_PROVIDER_SITE_OTHER): Payer: 59 | Admitting: Surgery

## 2012-07-21 ENCOUNTER — Encounter (INDEPENDENT_AMBULATORY_CARE_PROVIDER_SITE_OTHER): Payer: Self-pay | Admitting: Surgery

## 2012-07-21 VITALS — BP 130/82 | HR 72 | Temp 98.2°F | Resp 16 | Ht 72.0 in | Wt 189.2 lb

## 2012-07-21 DIAGNOSIS — Z85048 Personal history of other malignant neoplasm of rectum, rectosigmoid junction, and anus: Secondary | ICD-10-CM | POA: Insufficient documentation

## 2012-07-21 DIAGNOSIS — R1013 Epigastric pain: Secondary | ICD-10-CM

## 2012-07-21 DIAGNOSIS — K4090 Unilateral inguinal hernia, without obstruction or gangrene, not specified as recurrent: Secondary | ICD-10-CM

## 2012-07-21 NOTE — Patient Instructions (Signed)
Return in October to address left inguinal hernia

## 2012-07-21 NOTE — Telephone Encounter (Signed)
Called patient to followup on call from 8/19 reporting GERD symptoms, nausea and bloating.  Pt stated he was feeling much better.  States he had to switch from Nexium d/t insurance issues and was off medication for a few days, but upon restarting symptoms have resolved.   RN gave pt support and informed him per Dr. Dalene Carrow- if symptoms continue or if pt is anxious about recurrence Dr. Dalene Carrow will move CT and followup appointment up.  Pt stated he was feeling better and didn't feel that was necessary at this time.  Pt stated he would call if he changed his mind or symptoms return.

## 2012-07-21 NOTE — Progress Notes (Signed)
Patient ID: Devon Bray, male   DOB: 01-Jan-1966, 46 y.o.   MRN: [id number]  Chief Complaint  Patient presents with  . Follow-up    HPI Devon Bray is a 46 y.o. male.   HPI The patient returns in follow up due to history of rectal cancer. He is status post a laparoscopic low anterior resection one year ago. He completed chemotherapy in September of 2012. He is having epigastric pain after meals made better with nexium.  He is having heartburn.  CT set up for September for rectal cancer follow up. Past Medical History  Diagnosis Date  . Rectal cancer     rectal ca dx 10/2010  . Cancer 12/10/10    rectal  . Inguinal hernia   . Hx of radiation therapy 02/10/11 to 03/20/11    rectum    Past Surgical History  Procedure Date  . Rectal surgery   . Insert port a cath   . Thrombosed hemorroid   . Port-a-cath removal 12/22/2011    Procedure: REMOVAL PORT-A-CATH;  Surgeon: Clovis Pu. Roylene Heaton, MD;  Location: [facility];  Service: General;  Laterality: N/A;    Family History  Problem Relation Age of Onset  . Cancer Father     prostate  . Cancer Paternal Uncle 39    colon  . Lymphoma Maternal Aunt   . Lung cancer Maternal Grandmother   . Cancer Maternal Grandmother     lung  . Cancer Paternal Aunt     lymphoma  . Cancer Maternal Aunt     lung    Social History History  Substance Use Topics  . Smoking status: Never Smoker   . Smokeless tobacco: Not on file  . Alcohol Use: No    Allergies  Allergen Reactions  . Penicillins Rash and Other (See Comments)    Had when was a child    Current Outpatient Prescriptions  Medication Sig Dispense Refill  . acetaminophen (TYLENOL) 325 MG tablet Take by mouth as needed.       . fish oil-omega-3 fatty acids 1000 MG capsule Take 2 g by mouth daily.      Marland Kitchen ibuprofen (ADVIL,MOTRIN) 200 MG tablet Take 400 mg by mouth every 6 (six) hours as needed. Pain        . Multiple Vitamin (MULTIVITAMIN) tablet Take 1 tablet by mouth daily.        . pantoprazole (PROTONIX) 40 MG tablet Take 40 mg by mouth daily.        Review of Systems Review of Systems  Constitutional: Negative for fever, chills and unexpected weight change.  HENT: Negative for hearing loss, congestion, sore throat, trouble swallowing and voice change.   Eyes: Negative for visual disturbance.  Respiratory: Negative for cough and wheezing.   Cardiovascular: Negative for chest pain, palpitations and leg swelling.  Gastrointestinal: Negative for nausea, vomiting, abdominal pain, diarrhea, constipation, blood in stool, abdominal distention, anal bleeding and rectal pain.  Genitourinary: Negative for hematuria and difficulty urinating.  Musculoskeletal: Negative for arthralgias.  Skin: Negative for rash and wound.  Neurological: Negative for seizures, syncope, weakness and headaches.  Hematological: Negative for adenopathy. Does not bruise/bleed easily.  Psychiatric/Behavioral: Negative for confusion.    Blood pressure 130/82, pulse 72, temperature 98.2 F (36.8 C), temperature source Temporal, resp. rate 16, height 6' (1.829 m), weight 189 lb 3.2 oz (85.821 kg).  Physical Exam Physical Exam  Constitutional: He is oriented to person, place, and time. He appears well-developed and well-nourished.  HENT:  Head: Normocephalic and atraumatic.  Eyes: EOM are normal. Pupils are equal, round, and reactive to light.  Neck: Normal range of motion. Neck supple.  Cardiovascular: Normal rate and regular rhythm.   Abdomen:   Soft non tender negative Murphy's sign  No mass or distension scar healed without hernia LIH noted and reducible Neurological: He is alert and oriented to person, place, and time.  Skin: Skin is warm.  Psychiatric: He has a normal mood and affect. His behavior is normal. Judgment and thought content normal.       Assessment    History of rectal cancer and port a cath [facility] Epigastric pain post prandial    Plan    GI referral U/S to  evaluate for gallstones Return in October to set up time to repair LIH        Keltin Baird A. 07/21/2012, 12:04 PM

## 2012-07-26 ENCOUNTER — Ambulatory Visit
Admission: RE | Admit: 2012-07-26 | Discharge: 2012-07-26 | Disposition: A | Discharge status: Home / Self Care | Payer: 59 | Source: Ambulatory Visit | Attending: Surgery | Admitting: Surgery

## 2012-07-26 DIAGNOSIS — R1013 Epigastric pain: Secondary | ICD-10-CM

## 2012-08-04 ENCOUNTER — Telehealth: Payer: Self-pay | Admitting: *Deleted

## 2012-08-04 NOTE — Telephone Encounter (Signed)
Spoke with pt and informed him re:  Per Dr. Dalene Carrow,  Pt needs to contact his primary for fatigue issue.   Since Abdominal US was negative, pt needs to keep scans and f/u with Dr. Dalene Carrow as scheduled.   Pt voiced understanding.

## 2012-08-04 NOTE — Telephone Encounter (Signed)
Pt called stating that he feels more fatigue lately.   Pt has endoscopy scheduled for 08/17/12 with Dr. Madilyn Fireman.   Pt wanted to know if he should have CT scans moving to earlier time than 08/30/12.    Pt stated he had colonoscopy in Jan. 2013  With no issues per Dr. Madilyn Fireman.   Pt stated he also had abdominal US by Dr. Luisa Hart - with negative results.   Pt is being treated for acid reflux and ulcers by Dr. Madilyn Fireman. Instructed pt to notify Dr. Madilyn Fireman with above symptoms, and to keep appt with Dr. Madilyn Fireman as scheduled unless otherwise instructed.     Informed pt that Dr. Dalene Carrow will be notified of pt's increased fatigue symptoms;  Pt stated his appetite is good, has not noticed losing weight, just more tired. Pt's   Phone   [phone].

## 2012-08-30 ENCOUNTER — Ambulatory Visit (HOSPITAL_COMMUNITY)
Admission: RE | Admit: 2012-08-30 | Discharge: 2012-08-30 | Disposition: A | Discharge status: Home / Self Care | Payer: 59 | Source: Ambulatory Visit | Attending: Hematology and Oncology | Admitting: Hematology and Oncology

## 2012-08-30 ENCOUNTER — Other Ambulatory Visit (HOSPITAL_BASED_OUTPATIENT_CLINIC_OR_DEPARTMENT_OTHER): Payer: 59 | Admitting: Lab

## 2012-08-30 ENCOUNTER — Encounter (HOSPITAL_COMMUNITY): Payer: Self-pay

## 2012-08-30 DIAGNOSIS — Z9049 Acquired absence of other specified parts of digestive tract: Secondary | ICD-10-CM | POA: Insufficient documentation

## 2012-08-30 DIAGNOSIS — Z9221 Personal history of antineoplastic chemotherapy: Secondary | ICD-10-CM | POA: Insufficient documentation

## 2012-08-30 DIAGNOSIS — C20 Malignant neoplasm of rectum: Secondary | ICD-10-CM

## 2012-08-30 DIAGNOSIS — Z923 Personal history of irradiation: Secondary | ICD-10-CM | POA: Insufficient documentation

## 2012-08-30 DIAGNOSIS — K4090 Unilateral inguinal hernia, without obstruction or gangrene, not specified as recurrent: Secondary | ICD-10-CM | POA: Insufficient documentation

## 2012-08-30 LAB — CBC WITH DIFFERENTIAL/PLATELET
BASO%: 0.5 % (ref 0.0–2.0)
EOS%: 1.8 % (ref 0.0–7.0)
HCT: 43 % (ref 38.4–49.9)
MCH: 31.2 pg (ref 27.2–33.4)
MCHC: 33.9 g/dL (ref 32.0–36.0)
MCV: 91.9 fL (ref 79.3–98.0)
MONO%: 10.4 % (ref 0.0–14.0)
NEUT%: 67 % (ref 39.0–75.0)
lymph#: 1.2 10*3/uL (ref 0.9–3.3)

## 2012-08-30 LAB — COMPREHENSIVE METABOLIC PANEL (CC13)
AST: 20 U/L (ref 5–34)
Albumin: 4.2 g/dL (ref 3.5–5.0)
Alkaline Phosphatase: 73 U/L (ref 40–150)
BUN: 19 mg/dL (ref 7.0–26.0)
Creatinine: 0.9 mg/dL (ref 0.7–1.3)
Potassium: 4.1 mEq/L (ref 3.5–5.1)
Total Bilirubin: 0.7 mg/dL (ref 0.20–1.20)

## 2012-08-30 MED ORDER — IOHEXOL 300 MG/ML  SOLN
100.0000 mL | Freq: Once | INTRAMUSCULAR | Status: AC | PRN
  Administered 2012-08-30: 100 mL via INTRAVENOUS

## 2012-09-01 ENCOUNTER — Ambulatory Visit (HOSPITAL_BASED_OUTPATIENT_CLINIC_OR_DEPARTMENT_OTHER): Payer: 59 | Admitting: Hematology and Oncology

## 2012-09-01 ENCOUNTER — Telehealth: Payer: Self-pay | Admitting: Hematology and Oncology

## 2012-09-01 ENCOUNTER — Encounter: Payer: Self-pay | Admitting: Hematology and Oncology

## 2012-09-01 VITALS — BP 138/85 | HR 52 | Temp 96.9°F | Resp 20 | Ht 72.0 in | Wt 190.2 lb

## 2012-09-01 DIAGNOSIS — C189 Malignant neoplasm of colon, unspecified: Secondary | ICD-10-CM

## 2012-09-01 DIAGNOSIS — C20 Malignant neoplasm of rectum: Secondary | ICD-10-CM

## 2012-09-01 NOTE — Telephone Encounter (Signed)
appts made and printed for pt aom °

## 2012-09-01 NOTE — Patient Instructions (Signed)
HUNTER BACHAR  [id number]   [facility] - AFTER VISIT SUMMARY   **RECOMMENDATIONS MADE BY THE CONSULTANT AND ANY TEST    RESULTS WILL BE SENT TO YOUR REFERRING DOCTORS.   YOUR EXAM FINDINGS, LABS AND RESULTS WERE DISCUSSED BY YOUR MD TODAY.  YOU CAN GO TO THE [place] WEB SITE FOR INSTRUCTIONS ON HOW TO ASSESS MY CHART FOR ADDITIONAL INFORMATION AS NEEDED.  Your Updated drug allergies are: Allergies as of 09/01/2012 - Review Complete 09/01/2012  Allergen Reaction Noted  . Penicillins Rash and Other (See Comments) 03/31/2011    Your current list of medications are: Current Outpatient Prescriptions  Medication Sig Dispense Refill  . acetaminophen (TYLENOL) 325 MG tablet Take by mouth as needed.       . fish oil-omega-3 fatty acids 1000 MG capsule Take 1 g by mouth daily.       . Multiple Vitamin (MULTIVITAMIN) tablet Take 1 tablet by mouth daily.      Marland Kitchen ibuprofen (ADVIL,MOTRIN) 200 MG tablet Take 400 mg by mouth every 6 (six) hours as needed. Pain           INSTRUCTIONS GIVEN AND DISCUSSED:  See attached schedule   SPECIAL INSTRUCTIONS/FOLLOW-UP:  See above.  I acknowledge that I have been informed and understand all the instructions given to me and received a copy.I know to contact the clinic, my physician, or go to the emergency Department if any problems should occur.   I do not have any more questions at this time, but understand that I may call the [facility] at [phone] during business hours should I have any further questions or need assistance in obtaining follow-up care.

## 2012-09-01 NOTE — Progress Notes (Signed)
CC:   [name], M.D. [name], M.D. Thomas A. Cornett, M.D.  IDENTIFYING STATEMENT:  Patient is a 46 year old man with rectal cancer who presents for followup.  INTERVAL HISTORY:  The patient presents with no current concerns. Denies nausea or vomiting.  His weight is stable.  Denies rectal bleeding.  A recent colonoscopy was done 12/10/2011, which was unremarkable.  We reviewed results of a recent CT of the chest, abdomen and pelvis on 08/30/2012 and there is no evidence of metastatic disease within the chest, abdomen or pelvis.  There was no adenopathy.  MEDICATIONS:  Reviewed and updated.  ALLERGIES:  Penicillin.  PAST MEDICAL HISTORY/FAMILY HISTORY/SOCIAL HISTORY:  Unchanged.  REVIEW OF SYSTEMS:  Ten point review of systems negative.  PHYSICAL EXAM:  The patient is a well-appearing, well-nourished man in no distress.  Vitals:  pulse 52, blood pressure 138/85, temperature 96.9, respirations 20, weight 190.2 pounds.  HEENT:  Head is atraumatic, normocephalic.  Sclerae anicteric.  Mouth moist.  Chest:  Clear to percussion and auscultation.  Port:  No signs infection.  CVS: Unremarkable.  Abdomen:  Soft, nontender.  Bowel sounds present. Extremities:  No edema or calf tenderness.  Lymph nodes:  No adenopathy. CNS:  Nonfocal.  LABORATORY DATA:  08/30/2012:  White cell count 5.7, hemoglobin 14.6, hematocrit 43, platelets 192.  Sodium 140, potassium 4.1, chloride 104, CO2 was 26, BUN 19, creatinine 0.9, glucose 96. T-bilirubin 0.7, alkaline phosphatase 73, AST 20, ALT 19, calcium 9.8.  CEA 2.2.  Results of CTs as in interval history.  IMPRESSION AND PLAN:  Mr. Theil is a 46 year old man with history of rectal cancer who is status post low anterior resection on 12/10/2010 for 4.4 cm moderately differentiated carcinoma, 1/14 lymph nodes being positive.  He is status post external pelvic radiation with continuous 5- FU from 03/13/2011 to 03/10/2011.  He then  completed therapy with XELOX beginning 05/14/2011 to 08/28/2011.  His last colonoscopy was at 12/10/2011, which was unremarkable.  His current exam, CT scans and blood work indicate no evidence of recurrence.  He follows up in 6 months' time with labs only.    ______________________________ Laurice Record, M.D. LIO/MEDQ  D:  09/01/2012  T:  09/01/2012  Job:  [id number]

## 2012-09-01 NOTE — Progress Notes (Signed)
This office note has been dictated.

## 2012-09-20 ENCOUNTER — Encounter (INDEPENDENT_AMBULATORY_CARE_PROVIDER_SITE_OTHER): Payer: 59 | Admitting: Surgery

## 2012-09-28 ENCOUNTER — Encounter (INDEPENDENT_AMBULATORY_CARE_PROVIDER_SITE_OTHER): Payer: Self-pay | Admitting: Surgery

## 2012-09-28 ENCOUNTER — Ambulatory Visit (INDEPENDENT_AMBULATORY_CARE_PROVIDER_SITE_OTHER): Payer: 59 | Admitting: Surgery

## 2012-09-28 VITALS — BP 136/86 | HR 68 | Temp 98.0°F | Ht 72.0 in | Wt 188.8 lb

## 2012-09-28 DIAGNOSIS — K4090 Unilateral inguinal hernia, without obstruction or gangrene, not specified as recurrent: Secondary | ICD-10-CM

## 2012-09-28 NOTE — Progress Notes (Signed)
Patient ID: Devon Bray, male   DOB: 1966-01-07, 46 y.o.   MRN: [id number]  Chief Complaint  Patient presents with  . Follow-up    reck hernia    HPI Devon Bray is a 46 y.o. male.   HPI The patient returns in follow up due to history of rectal cancer and follow up of left inguinal hernia. He is status post a laparoscopic low anterior resection one year ago. He completed chemotherapy in September of 2012. He is having  No further epigastric pain after meals made better with nexium. Marland Kitchen  He wants his hernia fixed and is here today to set this up. Past Medical History  Diagnosis Date  . Rectal cancer     rectal ca dx 10/2010  . Cancer 12/10/10    rectal  . Inguinal hernia   . Hx of radiation therapy 02/10/11 to 03/20/11    rectum    Past Surgical History  Procedure Date  . Rectal surgery   . Insert port a cath   . Thrombosed hemorroid   . Port-a-cath removal 12/22/2011    Procedure: REMOVAL PORT-A-CATH;  Surgeon: Clovis Pu. Wendy Hoback, MD;  Location: [facility];  Service: General;  Laterality: N/A;    Family History  Problem Relation Age of Onset  . Cancer Father     prostate  . Cancer Paternal Uncle 27    colon  . Lymphoma Maternal Aunt   . Lung cancer Maternal Grandmother   . Cancer Maternal Grandmother     lung  . Cancer Paternal Aunt     lymphoma  . Cancer Maternal Aunt     lung    Social History History  Substance Use Topics  . Smoking status: Never Smoker   . Smokeless tobacco: Not on file  . Alcohol Use: No    Allergies  Allergen Reactions  . Penicillins Rash and Other (See Comments)    Had when was a child    Current Outpatient Prescriptions  Medication Sig Dispense Refill  . acetaminophen (TYLENOL) 325 MG tablet Take by mouth as needed.       . fish oil-omega-3 fatty acids 1000 MG capsule Take 1 g by mouth daily.       Marland Kitchen ibuprofen (ADVIL,MOTRIN) 200 MG tablet Take 400 mg by mouth every 6 (six) hours as needed. Pain        . Multiple Vitamin  (MULTIVITAMIN) tablet Take 1 tablet by mouth daily.      . Probiotic Product (ALIGN PO) Take 1 tablet by mouth daily.        Review of Systems Review of Systems  Constitutional: Negative for fever, chills and unexpected weight change.  HENT: Negative for hearing loss, congestion, sore throat, trouble swallowing and voice change.   Eyes: Negative for visual disturbance.  Respiratory: Negative for cough and wheezing.   Cardiovascular: Negative for chest pain, palpitations and leg swelling.  Gastrointestinal: Negative for nausea, vomiting, abdominal pain, diarrhea, constipation, blood in stool, abdominal distention, anal bleeding and rectal pain.  Genitourinary: Negative for hematuria and difficulty urinating.  Musculoskeletal: Negative for arthralgias.  Skin: Negative for rash and wound.  Neurological: Negative for seizures, syncope, weakness and headaches.  Hematological: Negative for adenopathy. Does not bruise/bleed easily.  Psychiatric/Behavioral: Negative for confusion.    Blood pressure 136/86, pulse 68, temperature 98 F (36.7 C), temperature source Temporal, height 6' (1.829 m), weight 188 lb 12.8 oz (85.639 kg), SpO2 98.00%.  Physical Exam Physical Exam  Constitutional: He is  oriented to person, place, and time. He appears well-developed and well-nourished.  HENT:  Head: Normocephalic and atraumatic.  Eyes: EOM are normal. Pupils are equal, round, and reactive to light.  Neck: Normal range of motion. Neck supple.  Cardiovascular: Normal rate and regular rhythm.   Abdomen:   Soft non tender negative Murphy's sign  No mass or distension scar healed without hernia LIH noted and reducible Neurological: He is alert and oriented to person, place, and time.  Skin: Skin is warm.  Psychiatric: He has a normal mood and affect. His behavior is normal. Judgment and thought content normal.       Assessment    History of rectal cancer and port a cath [facility]     Plan      repair [facility] with mesh. The risk of hernia repair include bleeding,  Infection,   Recurrence of the hernia,  Mesh use, chronic pain,  Organ injury,  Bowel injury,  Bladder injury,   nerve injury with numbness around the incision,  Death,  and worsening of preexisting  medical problems.  The alternatives to surgery have been discussed as well..  Long term expectations of both operative and non operative treatments have been discussed.   The patient agrees to proceed.        Kanyah Matsushima A. 09/28/2012, 11:07 AM

## 2012-09-28 NOTE — Patient Instructions (Signed)
Hernia  A hernia occurs when an internal organ pushes out through a weak spot in the abdominal wall. Hernias most commonly occur in the groin and around the navel. Hernias often can be pushed back into place (reduced). Most hernias tend to get worse over time. Some abdominal hernias can get stuck in the opening (irreducible or incarcerated hernia) and cannot be reduced. An irreducible abdominal hernia which is tightly squeezed into the opening is at risk for impaired blood supply (strangulated hernia). A strangulated hernia is a medical emergency. Because of the risk for an irreducible or strangulated hernia, surgery may be recommended to repair a hernia.  CAUSES   · Heavy lifting.  · Prolonged coughing.  · Straining to have a bowel movement.  · A cut (incision) made during an abdominal surgery.  HOME CARE INSTRUCTIONS   · Bed rest is not required. You may continue your normal activities.  · Avoid lifting more than 10 pounds (4.5 kg) or straining.  · Cough gently. If you are a smoker it is best to stop. Even the best hernia repair can break down with the continual strain of coughing. Even if you do not have your hernia repaired, a cough will continue to aggravate the problem.  · Do not wear anything tight over your hernia. Do not try to keep it in with an outside bandage or truss. These can damage abdominal contents if they are trapped within the hernia sac.  · Eat a normal diet.  · Avoid constipation. Straining over long periods of time will increase hernia size and encourage breakdown of repairs. If you cannot do this with diet alone, stool softeners may be used.  SEEK IMMEDIATE MEDICAL CARE IF:   · You have a fever.  · You develop increasing abdominal pain.  · You feel nauseous or vomit.  · Your hernia is stuck outside the abdomen, looks discolored, feels hard, or is tender.  · You have any changes in your bowel habits or in the hernia that are unusual for you.  · You have increased pain or swelling around the  hernia.  · You cannot push the hernia back in place by applying gentle pressure while lying down.  MAKE SURE YOU:   · Understand these instructions.  · Will watch your condition.  · Will get help right away if you are not doing well or get worse.  Document Released: 11/17/2005 Document Revised: 02/09/2012 Document Reviewed: 07/06/2008  ExitCare® Patient Information ©2013 [employer].  Hernia Repair  Care After  These instructions give you information on caring for yourself after your procedure. Your doctor may also give you more specific instructions. Call your doctor if you have any problems or questions after your procedure.  HOME CARE   · You may have changes in your poops (bowel movements).  · You may have loose or watery poop (diarrhea).  · You may be not able to poop.  · Your bowels will slowly get back to normal.  · Do not eat any food that makes you sick to your stomach (nauseous). Eat small meals 4 to 6 times a day instead of 3 large ones.  · Do not drink pop. It will give you gas.  · Do not drink alcohol.  · Do not lift anything heavier than 10 pounds. This is about the weight of a gallon of milk.  · Do not do anything that makes you very tired for at least 6 weeks.  · Do not get your   wound wet for 2 days.  · You may take a sponge bath during this time.  · After 2 days you may take a shower. Gently pat your surgical cut (incision) dry with a towel. Do not rub it.  · For men: You may have been given an athletic supporter (scrotal support) before you left the hospital. It holds your scrotum and testicles closer to your body so there is no strain on your wound. Wear the supporter until your doctor tells you that you do not need it anymore.  GET HELP RIGHT AWAY IF:  · You have watery poop, or cannot poop for more than 3 days.  · You feel sick to your stomach or throw up (vomit) more than 2 or 3 times.  · You have temperature by mouth above 102° F (38.9° C).  · You see redness or puffiness (swelling) around  your wound.  · You see yellowish white fluid (pus) coming from your wound.  · You see a bulge or bump in your lower belly (abdomen) or near your groin.  · You develop a rash, trouble breathing, or any other symptoms from medicines taken.  MAKE SURE YOU:  · Understand these instructions.  · Will watch your condition.  · Will get help right away if your are not doing well or get worse.  Document Released: 10/30/2008 Document Revised: 02/09/2012 Document Reviewed: 10/30/2008  ExitCare® Patient Information ©2013 [employer].

## 2012-10-07 ENCOUNTER — Telehealth (INDEPENDENT_AMBULATORY_CARE_PROVIDER_SITE_OTHER): Payer: Self-pay | Admitting: General Surgery

## 2012-10-07 NOTE — Telephone Encounter (Signed)
Devon Bray, pre surgical screening at [facility], called to ask if Dr. Luisa Hart would accept CXR, CBC and CMET done on 08/30/12.   All were negative at that time.  They have been ordered again for surgery on 10/12/12.  Dr. Luisa Hart paged for orders.

## 2012-10-07 NOTE — Telephone Encounter (Signed)
Per Dr. Luisa Hart, he does not need any of the tests repeated for this pt.  LMOM for [facility] at [facility] to this end.

## 2012-10-12 DIAGNOSIS — K4090 Unilateral inguinal hernia, without obstruction or gangrene, not specified as recurrent: Secondary | ICD-10-CM

## 2012-10-13 ENCOUNTER — Telehealth (INDEPENDENT_AMBULATORY_CARE_PROVIDER_SITE_OTHER): Payer: Self-pay | Admitting: General Surgery

## 2012-10-13 NOTE — Telephone Encounter (Signed)
[name] could you please give patient his post op appt 11-02-12 at 9:10

## 2012-10-13 NOTE — Telephone Encounter (Signed)
Patient called in s/p [facility] repair with mesh on 10/12/12 by Dr.Cornett complaining of Left hand pinky numbness...per instructions from Ms [facility] R.N.  Told patient to take 2 aleve every 12hours and to get a good phone number and she would call the patient and check on him tomorrow and see how he is doing...she stated that it didn't have anything to do with the surgery itself that it was probably how he was strapped down for the procedure...patient was okay with the instructions given and [name] will call him on 10/14/12 at [phone]

## 2012-10-14 ENCOUNTER — Telehealth (INDEPENDENT_AMBULATORY_CARE_PROVIDER_SITE_OTHER): Payer: Self-pay | Admitting: General Surgery

## 2012-10-14 NOTE — Telephone Encounter (Signed)
Called pt to follow up on his Lt hand pinky-finger numbness after surgery.  Today he reports it has resolved now.  Discussed how pressure on his ulnar nerve could have caused this.  He understands.  While talking with the pt he expressed concern with constipation.  Last BM was Monday evening.  Since then he is drinking lots of water and taking Miralax; he is passing gas.  Pt reports he has not yet even felt the urge to go.  Reassured pt and suggested trying to walk more today and consider drinking some grape or prune juice as well.  He will call back tomorrow if still has not had a BM.

## 2012-11-02 ENCOUNTER — Ambulatory Visit (INDEPENDENT_AMBULATORY_CARE_PROVIDER_SITE_OTHER): Payer: 59 | Admitting: Surgery

## 2012-11-02 ENCOUNTER — Encounter (INDEPENDENT_AMBULATORY_CARE_PROVIDER_SITE_OTHER): Payer: Self-pay | Admitting: Surgery

## 2012-11-02 VITALS — BP 122/72 | HR 72 | Temp 98.7°F | Resp 18 | Ht 72.0 in | Wt 187.0 lb

## 2012-11-02 DIAGNOSIS — Z9889 Other specified postprocedural states: Secondary | ICD-10-CM

## 2012-11-02 NOTE — Patient Instructions (Signed)
Resume full activity.  Miralax daily or every other day as needed.

## 2012-11-02 NOTE — Progress Notes (Signed)
Pt returns today after left inguinal  hernia repair.  Pain is well controlled.  Bowels are functioning but hard stool.  Wound is clean.  On exam:  Incision is clean /dry/intact.  Area is soft without signs of hernia recurrence.  Impression:  Status repair of  Left inguinal hernia  Plan:  RTC 6 months for cancer check Return to full activity.

## 2012-12-06 ENCOUNTER — Encounter: Payer: Self-pay | Admitting: Oncology

## 2012-12-06 ENCOUNTER — Telehealth: Payer: Self-pay | Admitting: Oncology

## 2012-12-06 NOTE — Telephone Encounter (Signed)
Pt called in re provider change. Pt will see Dr. Clelia Croft @ 9 on 3/4. Former pt Dr. Roma Kayser of Dr. Arie Sabina Letter/Calendar mailed.

## 2013-01-15 ENCOUNTER — Other Ambulatory Visit: Payer: Self-pay

## 2013-01-28 ENCOUNTER — Other Ambulatory Visit (HOSPITAL_BASED_OUTPATIENT_CLINIC_OR_DEPARTMENT_OTHER): Payer: 59 | Admitting: Lab

## 2013-01-28 DIAGNOSIS — C20 Malignant neoplasm of rectum: Secondary | ICD-10-CM

## 2013-01-28 DIAGNOSIS — C189 Malignant neoplasm of colon, unspecified: Secondary | ICD-10-CM

## 2013-01-28 LAB — CBC WITH DIFFERENTIAL/PLATELET
EOS%: 1.7 % (ref 0.0–7.0)
LYMPH%: 27.6 % (ref 14.0–49.0)
MCH: 30.2 pg (ref 27.2–33.4)
MCHC: 33.5 g/dL (ref 32.0–36.0)
MCV: 90 fL (ref 79.3–98.0)
MONO%: 9.3 % (ref 0.0–14.0)
Platelets: 205 10*3/uL (ref 140–400)
RBC: 5.04 10*6/uL (ref 4.20–5.82)
RDW: 13.4 % (ref 11.0–14.6)

## 2013-01-28 LAB — COMPREHENSIVE METABOLIC PANEL (CC13)
AST: 21 U/L (ref 5–34)
Albumin: 4.1 g/dL (ref 3.5–5.0)
Alkaline Phosphatase: 80 U/L (ref 40–150)
Potassium: 4.7 mEq/L (ref 3.5–5.1)
Sodium: 141 mEq/L (ref 136–145)
Total Bilirubin: 0.49 mg/dL (ref 0.20–1.20)
Total Protein: 7.7 g/dL (ref 6.4–8.3)

## 2013-02-01 ENCOUNTER — Ambulatory Visit (HOSPITAL_BASED_OUTPATIENT_CLINIC_OR_DEPARTMENT_OTHER): Payer: 59 | Admitting: Oncology

## 2013-02-01 ENCOUNTER — Ambulatory Visit: Payer: 59 | Admitting: Hematology and Oncology

## 2013-02-01 ENCOUNTER — Telehealth: Payer: Self-pay | Admitting: Oncology

## 2013-02-01 VITALS — BP 134/84 | HR 66 | Temp 97.6°F | Resp 20 | Ht 72.0 in | Wt 192.3 lb

## 2013-02-01 DIAGNOSIS — C189 Malignant neoplasm of colon, unspecified: Secondary | ICD-10-CM

## 2013-02-01 DIAGNOSIS — C20 Malignant neoplasm of rectum: Secondary | ICD-10-CM

## 2013-02-01 NOTE — Telephone Encounter (Signed)
gv and printed appt schedule for Dec...pt awre cs will contact with d/t of c

## 2013-02-01 NOTE — Progress Notes (Signed)
Hematology and Oncology Follow Up Visit  Devon Bray [id number] 1966/07/19 47 y.o. 02/01/2013 9:10 AM   Principle Diagnosis: 47 year old with rectal cancer diagnosed in 2012. He had Stage III disease.    Prior Therapy:  He is S/P anterior resection on 12/10/2010 for 4.4 cm moderately differentiated carcinoma, 1/14 lymph nodes being positive. He is S/P external pelvic radiation with continuous 5- FU from 03/13/2011 to 03/10/2011. He is S/P XELOX beginning 05/14/2011 to 08/28/2011.   Current therapy: Observation and follow up.   Interim History: Devon Bray presents today for a follow up visit. He is a nice man with the above history. Since his last visit, he reports no new symptoms. 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 No GI symptoms noted, but did have symptoms of indigestion. No rectal bleeding and no change is his eating habits.   Medications: I have reviewed the patient's current medications. Current outpatient prescriptions:acetaminophen (TYLENOL) 325 MG tablet, Take by mouth as needed. , Disp: , Rfl: ;  fish oil-omega-3 fatty acids 1000 MG capsule, Take 1 g by mouth daily. , Disp: , Rfl: ;  ibuprofen (ADVIL,MOTRIN) 200 MG tablet, Take 400 mg by mouth every 6 (six) hours as needed. Pain  , Disp: , Rfl: ;  Multiple Vitamin (MULTIVITAMIN) tablet, Take 1 tablet by mouth daily., Disp: , Rfl:  Probiotic Product (ALIGN PO), Take 1 tablet by mouth daily., Disp: , Rfl:   Allergies:  Allergies  Allergen Reactions  . Penicillins Rash and Other (See Comments)    Had when was a child    Past Medical History, Surgical history, Social history, and Family History were reviewed and updated.  Review of Systems: Constitutional:  Negative for fever, chills, night sweats, anorexia, weight loss, pain. Cardiovascular: no chest pain or dyspnea on exertion Respiratory: negative Neurological: negative Dermatological: negative ENT: negative Skin:  Negative. Gastrointestinal: negative Genito-Urinary: negative Hematological and Lymphatic: negative Breast: negative Musculoskeletal: negative Remaining ROS negative. Physical Exam: Blood pressure 134/84, pulse 66, temperature 97.6 F (36.4 C), temperature source Oral, resp. rate 20, height 6' (1.829 m), weight 192 lb 4.8 oz (87.227 kg). ECOG: 0 General appearance: alert Head: Normocephalic, without obvious abnormality, atraumatic Neck: no adenopathy, no carotid bruit, no JVD, supple, symmetrical, trachea midline and thyroid not enlarged, symmetric, no tenderness/mass/nodules Lymph nodes: Cervical, supraclavicular, and axillary nodes normal. Heart:regular rate and rhythm, S1, S2 normal, no murmur, click, rub or gallop Lung:chest clear, no wheezing, rales, normal symmetric air entry Abdomin: soft, non-tender, without masses or organomegaly EXT:no erythema, induration, or nodules   Lab Results: Lab Results  Component Value Date   WBC 5.9 01/28/2013   HGB 15.2 01/28/2013   HCT 45.4 01/28/2013   MCV 90.0 01/28/2013   PLT 205 01/28/2013     Chemistry      Component Value Date/Time   NA 141 01/28/2013 0757   NA 141 02/27/2012 0752   NA 142 11/04/2011 0803   K 4.7 01/28/2013 0757   K 4.4 02/27/2012 0752   K 4.4 11/04/2011 0803   CL 103 01/28/2013 0757   CL 104 02/27/2012 0752   CL 100 11/04/2011 0803   CO2 29 01/28/2013 0757   CO2 28 02/27/2012 0752   CO2 30 11/04/2011 0803   BUN 17.3 01/28/2013 0757   BUN 19 02/27/2012 0752   BUN 16 11/04/2011 0803   CREATININE 1.0 01/28/2013 0757   CREATININE 0.93 02/27/2012 0752   CREATININE 0.9 11/04/2011 0803  Component Value Date/Time   CALCIUM 9.8 01/28/2013 0757   CALCIUM 9.3 02/27/2012 0752   CALCIUM 8.4 11/04/2011 0803   ALKPHOS 80 01/28/2013 0757   ALKPHOS 73 02/27/2012 0752   ALKPHOS 73 11/04/2011 0803   AST 21 01/28/2013 0757   AST 17 02/27/2012 0752   AST 27 11/04/2011 0803   ALT 23 01/28/2013 0757   ALT 17 02/27/2012 0752   BILITOT 0.49  01/28/2013 0757   BILITOT 0.5 02/27/2012 0752   BILITOT 0.70 11/04/2011 0803       Impression and Plan:  47 year old man with history of rectal cancer who is status post low anterior resection on 12/10/2010 followed by radiation therapy with chemotherapy. That was followed by chemotherapy.  He has no evidence of disease at this point.  He will continue active surveillance  And repeat CT scan, lab and exam in 10/2013.  Next colonoscopy will be around 12/2014.     [facility], MD 3/4/20149:10 AM

## 2013-05-09 ENCOUNTER — Ambulatory Visit (INDEPENDENT_AMBULATORY_CARE_PROVIDER_SITE_OTHER): Payer: 59 | Admitting: Surgery

## 2013-05-09 ENCOUNTER — Encounter (INDEPENDENT_AMBULATORY_CARE_PROVIDER_SITE_OTHER): Payer: Self-pay | Admitting: Surgery

## 2013-05-09 VITALS — BP 118/70 | HR 69 | Temp 97.3°F | Resp 17 | Ht 72.0 in | Wt 190.8 lb

## 2013-05-09 DIAGNOSIS — Z85048 Personal history of other malignant neoplasm of rectum, rectosigmoid junction, and anus: Secondary | ICD-10-CM

## 2013-05-09 NOTE — Progress Notes (Signed)
Patient ID: Devon Bray, male   DOB: 12-Dec-1965, 47 y.o.   MRN: [id number]  Chief Complaint  Patient presents with  . Follow-up    LTFU 6 mo colon reck    HPI Devon Bray is a 47 y.o. male.   HPI The patient returns in follow up due to history of rectal cancer. He is status post a laparoscopic low anterior resection one year ago. He completed chemotherapy in September of this year. He is doing well. He denies any rectal pain, rectal bleeding or abdominal pain. He is here to have his port removed. Past Medical History  Diagnosis Date  . Rectal cancer     rectal ca dx 10/2010  . Cancer 12/10/10    rectal  . Inguinal hernia   . Hx of radiation therapy 02/10/11 to 03/20/11    rectum    Past Surgical History  Procedure Laterality Date  . Rectal surgery    . Insert port a cath    . Thrombosed hemorroid    . Port-a-cath removal  12/22/2011    Procedure: REMOVAL PORT-A-CATH;  Surgeon: Cl[place]is [place] Beck, MD;  Location: [facility];  Service: General;  Laterality: N/A;    Family History  Problem Relation Age of Onset  . Cancer Father     prostate  . Cancer Paternal Uncle 22    colon  . Lymphoma Maternal Aunt   . Lung cancer Maternal Grandmother   . Cancer Maternal Grandmother     lung  . Cancer Paternal Aunt     lymphoma  . Cancer Maternal Aunt     lung    Social History History  Substance Use Topics  . Smoking status: Never Smoker   . Smokeless tobacco: Not on file  . Alcohol Use: No    Allergies  Allergen Reactions  . Penicillins Rash and Other (See Comments)    Had when was a child    Current Outpatient Prescriptions  Medication Sig Dispense Refill  . fish oil-omega-3 fatty acids 1000 MG capsule Take 1 g by mouth daily.       . Multiple Vitamin (MULTIVITAMIN) tablet Take 1 tablet by mouth daily.      . Probiotic Product (ALIGN PO) Take 1 tablet by mouth daily.       No current facility-administered medications for this visit.    Review of  Systems Review of Systems  Constitutional: Negative for fever, chills and unexpected weight change.  HENT: Negative for hearing loss, congestion, sore throat, trouble swallowing and voice change.   Eyes: Negative for visual disturbance.  Respiratory: Negative for cough and wheezing.   Cardiovascular: Negative for chest pain, palpitations and leg swelling.  Gastrointestinal: Negative for nausea, vomiting, abdominal pain, diarrhea, constipation, blood in stool, abdominal distention, anal bleeding and rectal pain.  Genitourinary: Negative for hematuria and difficulty urinating.  Musculoskeletal: Negative for arthralgias.  Skin: Negative for rash and wound.  Neurological: Negative for seizures, syncope, weakness and headaches.  Hematological: Negative for adenopathy. Does not bruise/bleed easily.  Psychiatric/Behavioral: Negative for confusion.    Blood pressure 118/70, pulse 69, temperature 97.3 F (36.3 C), temperature source Temporal, resp. rate 17, height 6' (1.829 m), weight 190 lb 12.8 oz (86.546 kg), SpO2 98.00%.  Physical Exam Physical Exam  Constitutional: He is oriented to person, place, and time. He appears well-developed and well-nourished.  HENT:  Head: Normocephalic and atraumatic.  Eyes: EOM are normal. Pupils are equal, round, and reactive to light.  Neck: Normal range  of motion. Neck supple.  Cardiovascular: Normal rate and regular rhythm.   Pulmonary/Chest:       CTA Neurological: He is alert and oriented to person, place, and time.  Skin: Skin is warm.  Psychiatric: He has a normal mood and affect. His behavior is normal. Judgment and thought content normal.  Abdomen: soft NT  No hernia  Scars noted  Data Reviewed Clinical Data: 47 year old male with personal history of rectal  carcinoma - follow-up. The patient has undergone chemotherapy,  radiation and partial colectomy.  CT CHEST, ABDOMEN AND PELVIS WITH CONTRAST  Technique: Multidetector CT imaging of the  chest, abdomen and  pelvis was performed following the standard protocol during bolus  administration of intravenous contrast.  Contrast: 100 ml intravenous Omnipaque-300  Comparison: 11/04/2011 and 12/30/2010 CTs. 11/12/2010 PET CT.  CT CHEST  Findings: The heart and great vessels are unremarkable.  There are no pleural or pericardial effusions present.  No enlarged lymph nodes are identified.  Mild bilateral gynecomastia is identified.  There is no evidence of pulmonary nodule or mass.  There is no evidence of airspace disease, consolidation or  endobronchial/endotracheal lesions.  No acute or suspicious bony abnormalities are identified.  IMPRESSION:  Normal chest CT with contrast - no evidence of acute abnormality or  metastatic disease.  CT ABDOMEN AND PELVIS  Findings: The liver, spleen, adrenal glands, pancreas, gallbladder  and kidneys are unremarkable.  No free fluid, enlarged lymph nodes, biliary dilation or abdominal  aortic aneurysm identified.  Evidence of partial distal colectomy noted. Mild stranding in the  perirectal region is unchanged without discrete mass or enlarged  lymph nodes.  No other bowel abnormalities are identified.  A small left inguinal hernia containing fat is again identified.  No acute or suspicious bony abnormalities are present. Mild to  moderate degenerative disc disease at L4-L5 and L5-S1 again noted.  IMPRESSION:  No evidence of acute abnormality or recurrent/metastatic disease.  Small left inguinal hernia containing fat.    Assessment    History of rectal cancer no evidence of recurrence S/P [facility] repair    Plan    Return 1 year.  Colonoscopy in 2016   Tambi Thole A. 05/09/2013, 9:22 AM

## 2013-05-09 NOTE — Patient Instructions (Signed)
follow up 1 year

## 2013-07-12 ENCOUNTER — Other Ambulatory Visit: Payer: Self-pay | Admitting: Family Medicine

## 2013-07-12 ENCOUNTER — Ambulatory Visit
Admission: RE | Admit: 2013-07-12 | Discharge: 2013-07-12 | Disposition: A | Discharge status: Home / Self Care | Payer: 59 | Source: Ambulatory Visit | Attending: Family Medicine | Admitting: Family Medicine

## 2013-07-12 DIAGNOSIS — R079 Chest pain, unspecified: Secondary | ICD-10-CM

## 2013-10-06 ENCOUNTER — Other Ambulatory Visit: Payer: Self-pay

## 2013-11-01 ENCOUNTER — Encounter (HOSPITAL_COMMUNITY): Payer: Self-pay

## 2013-11-01 ENCOUNTER — Other Ambulatory Visit (HOSPITAL_BASED_OUTPATIENT_CLINIC_OR_DEPARTMENT_OTHER): Payer: 59

## 2013-11-01 ENCOUNTER — Ambulatory Visit (HOSPITAL_COMMUNITY)
Admission: RE | Admit: 2013-11-01 | Discharge: 2013-11-01 | Disposition: A | Discharge status: Home / Self Care | Payer: 59 | Source: Ambulatory Visit | Attending: Oncology | Admitting: Oncology

## 2013-11-01 DIAGNOSIS — C20 Malignant neoplasm of rectum: Secondary | ICD-10-CM

## 2013-11-01 DIAGNOSIS — C189 Malignant neoplasm of colon, unspecified: Secondary | ICD-10-CM

## 2013-11-01 DIAGNOSIS — C19 Malignant neoplasm of rectosigmoid junction: Secondary | ICD-10-CM | POA: Insufficient documentation

## 2013-11-01 LAB — CBC WITH DIFFERENTIAL/PLATELET
EOS%: 1.5 % (ref 0.0–7.0)
MCH: 30.6 pg (ref 27.2–33.4)
MCV: 92.3 fL (ref 79.3–98.0)
MONO%: 10.7 % (ref 0.0–14.0)
RBC: 4.67 10*6/uL (ref 4.20–5.82)
RDW: 13.3 % (ref 11.0–14.6)

## 2013-11-01 LAB — COMPREHENSIVE METABOLIC PANEL (CC13)
AST: 24 U/L (ref 5–34)
Albumin: 4.2 g/dL (ref 3.5–5.0)
Alkaline Phosphatase: 68 U/L (ref 40–150)
Potassium: 4.3 mEq/L (ref 3.5–5.1)
Sodium: 140 mEq/L (ref 136–145)
Total Protein: 7.4 g/dL (ref 6.4–8.3)

## 2013-11-01 MED ORDER — IOHEXOL 300 MG/ML  SOLN
100.0000 mL | Freq: Once | INTRAMUSCULAR | Status: AC | PRN
  Administered 2013-11-01: 100 mL via INTRAVENOUS

## 2013-11-03 ENCOUNTER — Telehealth: Payer: Self-pay | Admitting: Oncology

## 2013-11-03 ENCOUNTER — Ambulatory Visit (HOSPITAL_BASED_OUTPATIENT_CLINIC_OR_DEPARTMENT_OTHER): Payer: 59 | Admitting: Oncology

## 2013-11-03 VITALS — BP 134/84 | HR 62 | Temp 97.0°F | Resp 18 | Ht 72.0 in | Wt 199.3 lb

## 2013-11-03 DIAGNOSIS — C20 Malignant neoplasm of rectum: Secondary | ICD-10-CM

## 2013-11-03 DIAGNOSIS — C189 Malignant neoplasm of colon, unspecified: Secondary | ICD-10-CM

## 2013-11-03 NOTE — Telephone Encounter (Signed)
Gave pt appt for lab, MD and CT for November 2015, gave pt oral contrast for CT

## 2013-11-03 NOTE — Progress Notes (Signed)
Hematology and Oncology Follow Up Visit  Devon Bray [id number] 10-Mar-1966 47 y.o. 11/03/2013 9:02 AM   Principle Diagnosis: 47 year old with rectal cancer diagnosed in 2012. He had Stage III disease.    Prior Therapy:  He is S/P anterior resection on 12/10/2010 for 4.4 cm moderately differentiated carcinoma, 1/14 lymph nodes being positive. He is S/P external pelvic radiation with continuous 5- FU from 03/13/2011 to 03/10/2011. He is S/P XELOX beginning 05/14/2011 to 08/28/2011.   Current therapy: Observation and follow up.   Interim History: Devon Bray presents today for a follow up visit with his wife.Marland Kitchen He is a nice man with the above history. Since his last visit, he reports no new symptoms. 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 He reports erratic bowel movements with alternating constipation and occasional diarrhea but have been consistently like this since his operation. He has not reported any fevers or chills or sweats. Does not report any hematochezia or melena. He has not reported any change in his appetite and actually gained more weight.  Medications: I have reviewed the patient's current medications.  Current Outpatient Prescriptions  Medication Sig Dispense Refill  . fish oil-omega-3 fatty acids 1000 MG capsule Take 1 g by mouth daily.       . Multiple Vitamin (MULTIVITAMIN) tablet Take 1 tablet by mouth daily.      . Probiotic Product (ALIGN PO) Take 1 tablet by mouth daily.       No current facility-administered medications for this visit.    Allergies:  Allergies  Allergen Reactions  . Penicillins Rash and Other (See Comments)    Had when was a child    Past Medical History, Surgical history, Social history, and Family History were reviewed and updated.  Review of Systems:  Remaining ROS negative. Physical Exam: Blood pressure 134/84, pulse 62, temperature 97 F (36.1 C), temperature source Oral, resp. rate 18,  height 6' (1.829 m), weight 199 lb 4.8 oz (90.402 kg), SpO2 100.00%. ECOG: 0 General appearance: alert Head: Normocephalic, without obvious abnormality, atraumatic Neck: no adenopathy, no carotid bruit, no JVD, supple, symmetrical, trachea midline and thyroid not enlarged, symmetric, no tenderness/mass/nodules Lymph nodes: Cervical, supraclavicular, and axillary nodes normal. Heart:regular rate and rhythm, S1, S2 normal, no murmur, click, rub or gallop Lung:chest clear, no wheezing, rales, normal symmetric air entry Abdomin: soft, non-tender, without masses or organomegaly EXT:no erythema, induration, or nodules   Lab Results: Lab Results  Component Value Date   WBC 5.7 11/01/2013   HGB 14.3 11/01/2013   HCT 43.1 11/01/2013   MCV 92.3 11/01/2013   PLT 198 11/01/2013     Chemistry      Component Value Date/Time   NA 140 11/01/2013 0816   NA 141 02/27/2012 0752   NA 142 11/04/2011 0803   K 4.3 11/01/2013 0816   K 4.4 02/27/2012 0752   K 4.4 11/04/2011 0803   CL 103 01/28/2013 0757   CL 104 02/27/2012 0752   CL 100 11/04/2011 0803   CO2 26 11/01/2013 0816   CO2 28 02/27/2012 0752   CO2 30 11/04/2011 0803   BUN 15.3 11/01/2013 0816   BUN 19 02/27/2012 0752   BUN 16 11/04/2011 0803   CREATININE 0.9 11/01/2013 0816   CREATININE 0.93 02/27/2012 0752   CREATININE 0.9 11/04/2011 0803      Component Value Date/Time   CALCIUM 9.7 11/01/2013 0816   CALCIUM 9.3 02/27/2012 0752   CALCIUM 8.4 11/04/2011  0803   ALKPHOS 68 11/01/2013 0816   ALKPHOS 73 02/27/2012 0752   ALKPHOS 73 11/04/2011 0803   AST 24 11/01/2013 0816   AST 17 02/27/2012 0752   AST 27 11/04/2011 0803   ALT 22 11/01/2013 0816   ALT 17 02/27/2012 0752   ALT 28 11/04/2011 0803   BILITOT 0.59 11/01/2013 0816   BILITOT 0.5 02/27/2012 0752   BILITOT 0.70 11/04/2011 0803     Results for Devon, Bray (MRN [id number]) as of 11/03/2013 08:47  Ref. Range 11/01/2013 08:16  CEA Latest Range: 0.0-5.0 ng/mL 2.7   EXAM:  CT CHEST, ABDOMEN, AND  PELVIS WITH CONTRAST  TECHNIQUE:  Multidetector CT imaging of the chest, abdomen and pelvis was  performed following the standard protocol during bolus  administration of intravenous contrast.  CONTRAST: OMNIPAQUE IOHEXOL 300 MG/ML SOLN  COMPARISON: 08/30/2012  FINDINGS:  CT CHEST FINDINGS  No pleural effusion identified. No airspace consolidation or  atelectasis identified. No suspicious pulmonary parenchymal nodule  or mass identified. The trachea appears patent and is midline.  The heart size appears normal. No pericardial effusion. No enlarged  mediastinal or hilar lymph nodes. There is no enlarged axillary or  supraclavicular lymph node.  Review of the visualized osseous structures is negative for  aggressive lytic or sclerotic bone lesion.  CT ABDOMEN AND PELVIS FINDINGS  No suspicious liver abnormality identified. The gallbladder appears  normal. No biliary dilatation. Normal appearance of the pancreas.  The spleen is unremarkable.  The adrenal glands are both normal. The kidneys are both on  unremarkable. Prostate gland is on unremarkable.  Normal caliber of the abdominal aorta. No upper abdominal aneurysm.  No upper abdominal adenopathy identified. There is no enlarged  pelvic or inguinal lymph nodes identified.  The stomach appears normal. The small bowel loops have a normal  course and caliber. No obstruction. The normal appearance of the  proximal colon. Postoperative changes at the rectosigmoid junction  noted. Suture line appears intact. No specific features identified  to suggest residual or recurrent local tumor.  Review of the visualized osseous structures is significant for mild  degenerative disc disease.  IMPRESSION:  CT chest:  1. No evidence for metastatic disease.  CT abdomen and pelvis:  1. No specific features identified to suggest residual or recurrence  of tumor within the abdomen or pelvis.   Impression and Plan:  47 year old man with  history of rectal cancer who is status post low anterior resection on 12/10/2010 followed by radiation therapy with chemotherapy. That was followed by chemotherapy.  CT scan and laboratory data from 11/01/2013 were discussed today and showed no evidence of relapse of recurrent disease. He is close to 3 years out from his surgery and plan to continue with active surveillance with an annual followup and a CT scan. I do not anticipate any further imaging studies be on the 2016. He will be due for his next colonoscopy in January of 2016.    [facility], MD 12/4/20149:02 AM

## 2014-05-15 ENCOUNTER — Ambulatory Visit (INDEPENDENT_AMBULATORY_CARE_PROVIDER_SITE_OTHER): Payer: 59 | Admitting: Surgery

## 2014-05-15 ENCOUNTER — Encounter (INDEPENDENT_AMBULATORY_CARE_PROVIDER_SITE_OTHER): Payer: Self-pay | Admitting: Surgery

## 2014-05-15 VITALS — BP 126/80 | HR 65 | Temp 98.3°F | Ht 72.0 in | Wt 188.0 lb

## 2014-05-15 DIAGNOSIS — Z85048 Personal history of other malignant neoplasm of rectum, rectosigmoid junction, and anus: Secondary | ICD-10-CM

## 2014-05-15 NOTE — Progress Notes (Signed)
Patient ID: Devon Bray, male   DOB: Dec 27, 1965, 48 y.o.   MRN: [id number]  Chief Complaint  Patient presents with  . Follow-up    HPI Devon Bray is a 48 y.o. male.   HPI The patient returns in follow up due to history of rectal cancer. He is status post a laparoscopic low anterior resection two  Years  ago.  Doing well.  Occasional issues with constipation.  Past Medical History  Diagnosis Date  . Rectal cancer     rectal ca dx 10/2010  . Cancer 12/10/10    rectal  . Inguinal hernia   . Hx of radiation therapy 02/10/11 to 03/20/11    rectum    Past Surgical History  Procedure Laterality Date  . Rectal surgery    . Insert port a cath    . Thrombosed hemorroid    . Port-a-cath removal  12/22/2011    Procedure: REMOVAL PORT-A-CATH;  Surgeon: Joyice Faster. Davielle Lingelbach, MD;  Location: [facility];  Service: General;  Laterality: N/A;    Family History  Problem Relation Age of Onset  . Cancer Father     prostate  . Cancer Paternal Uncle 53    colon  . Lymphoma Maternal Aunt   . Lung cancer Maternal Grandmother   . Cancer Maternal Grandmother     lung  . Cancer Paternal Aunt     lymphoma  . Cancer Maternal Aunt     lung    Social History History  Substance Use Topics  . Smoking status: Never Smoker   . Smokeless tobacco: Not on file  . Alcohol Use: No    Allergies  Allergen Reactions  . Penicillins Rash and Other (See Comments)    Had when was a child    Current Outpatient Prescriptions  Medication Sig Dispense Refill  . fish oil-omega-3 fatty acids 1000 MG capsule Take 1 g by mouth daily.       . Multiple Vitamin (MULTIVITAMIN) tablet Take 1 tablet by mouth daily.      . Probiotic Product (ALIGN PO) Take 1 tablet by mouth daily.       No current facility-administered medications for this visit.    Review of Systems Review of Systems  Constitutional: Negative for fever, chills and unexpected weight change.  HENT: Negative for hearing loss, congestion, sore  throat, trouble swallowing and voice change.   Eyes: Negative for visual disturbance.  Respiratory: Negative for cough and wheezing.   Cardiovascular: Negative for chest pain, palpitations and leg swelling.  Gastrointestinal: Negative for nausea, vomiting, abdominal pain, diarrhea, constipation, blood in stool, abdominal distention, anal bleeding and rectal pain.  Genitourinary: Negative for hematuria and difficulty urinating.  Musculoskeletal: Negative for arthralgias.  Skin: Negative for rash and wound.  Neurological: Negative for seizures, syncope, weakness and headaches.  Hematological: Negative for adenopathy. Does not bruise/bleed easily.  Psychiatric/Behavioral: Negative for confusion.    Blood pressure 126/80, pulse 65, temperature 98.3 F (36.8 C), height 6' (1.829 m), weight 188 lb (85.276 kg).  Physical Exam Physical Exam  Constitutional: He is oriented to person, place, and time. He appears well-developed and well-nourished.  HENT:  Head: Normocephalic and atraumatic.  Eyes: EOM are normal. Pupils are equal, round, and reactive to light.  Neck: Normal range of motion. Neck supple.  Cardiovascular: Normal rate and regular rhythm.   Pulmonary/Chest:       CTA Neurological: He is alert and oriented to person, place, and time.  Skin: Skin is warm.  Psychiatric: He has a normal mood and affect. His behavior is normal. Judgment and thought content normal.  Abdomen: soft NT  No hernia  Scars noted  Data Reviewed CLINICAL DATA: Colorectal carcinoma  EXAM:  CT CHEST, ABDOMEN, AND PELVIS WITH CONTRAST  TECHNIQUE:  Multidetector CT imaging of the chest, abdomen and pelvis was  performed following the standard protocol during bolus  administration of intravenous contrast.  CONTRAST: 197mL OMNIPAQUE IOHEXOL 300 MG/ML SOLN  COMPARISON: 08/30/2012  FINDINGS:  CT CHEST FINDINGS  No pleural effusion identified. No airspace consolidation or  atelectasis identified. No suspicious  pulmonary parenchymal nodule  or mass identified. The trachea appears patent and is midline.  The heart size appears normal. No pericardial effusion. No enlarged  mediastinal or hilar lymph nodes. There is no enlarged axillary or  supraclavicular lymph node.  Review of the visualized osseous structures is negative for  aggressive lytic or sclerotic bone lesion.  CT ABDOMEN AND PELVIS FINDINGS  No suspicious liver abnormality identified. The gallbladder appears  normal. No biliary dilatation. Normal appearance of the pancreas.  The spleen is unremarkable.  The adrenal glands are both normal. The kidneys are both on  unremarkable. Prostate gland is on unremarkable.  Normal caliber of the abdominal aorta. No upper abdominal aneurysm.  No upper abdominal adenopathy identified. There is no enlarged  pelvic or inguinal lymph nodes identified.  The stomach appears normal. The small bowel loops have a normal  course and caliber. No obstruction. The normal appearance of the  proximal colon. Postoperative changes at the rectosigmoid junction  noted. Suture line appears intact. No specific features identified  to suggest residual or recurrent local tumor.  Review of the visualized osseous structures is significant for mild  degenerative disc disease.  IMPRESSION:  CT chest:  1. No evidence for metastatic disease.  CT abdomen and pelvis:  1. No specific features identified to suggest residual or recurrence  of tumor within the abdomen or pelvis.  Electronically Signed  By: Kerby Moors M.D.  On: 11/01/2013 10:28    Assessment    History of rectal cancer no evidence of recurrence   Plan    Return 1 year.  Colonoscopy in 2016   Ramaj Frangos A. 05/15/2014, 9:25 AM

## 2014-05-15 NOTE — Patient Instructions (Signed)
Return 1 year.  Colonoscopy 2016

## 2014-10-13 ENCOUNTER — Ambulatory Visit (HOSPITAL_COMMUNITY)
Admission: RE | Admit: 2014-10-13 | Discharge: 2014-10-13 | Disposition: A | Discharge status: Home / Self Care | Payer: 59 | Source: Ambulatory Visit | Attending: Oncology | Admitting: Oncology

## 2014-10-13 ENCOUNTER — Other Ambulatory Visit (HOSPITAL_BASED_OUTPATIENT_CLINIC_OR_DEPARTMENT_OTHER): Payer: 59

## 2014-10-13 ENCOUNTER — Encounter (HOSPITAL_COMMUNITY): Payer: Self-pay

## 2014-10-13 DIAGNOSIS — C19 Malignant neoplasm of rectosigmoid junction: Secondary | ICD-10-CM | POA: Diagnosis not present

## 2014-10-13 DIAGNOSIS — Z923 Personal history of irradiation: Secondary | ICD-10-CM | POA: Insufficient documentation

## 2014-10-13 DIAGNOSIS — C189 Malignant neoplasm of colon, unspecified: Secondary | ICD-10-CM

## 2014-10-13 DIAGNOSIS — Z9221 Personal history of antineoplastic chemotherapy: Secondary | ICD-10-CM | POA: Insufficient documentation

## 2014-10-13 DIAGNOSIS — C20 Malignant neoplasm of rectum: Secondary | ICD-10-CM

## 2014-10-13 LAB — CBC WITH DIFFERENTIAL/PLATELET
BASO%: 0.7 % (ref 0.0–2.0)
Basophils Absolute: 0 10*3/uL (ref 0.0–0.1)
EOS ABS: 0.1 10*3/uL (ref 0.0–0.5)
EOS%: 1.8 % (ref 0.0–7.0)
HCT: 46.5 % (ref 38.4–49.9)
HEMOGLOBIN: 15.4 g/dL (ref 13.0–17.1)
LYMPH#: 1.9 10*3/uL (ref 0.9–3.3)
LYMPH%: 29.3 % (ref 14.0–49.0)
MCH: 29.9 pg (ref 27.2–33.4)
MCHC: 33.2 g/dL (ref 32.0–36.0)
MCV: 90.1 fL (ref 79.3–98.0)
MONO#: 0.6 10*3/uL (ref 0.1–0.9)
MONO%: 8.6 % (ref 0.0–14.0)
NEUT%: 59.6 % (ref 39.0–75.0)
NEUTROS ABS: 4 10*3/uL (ref 1.5–6.5)
Platelets: 212 10*3/uL (ref 140–400)
RBC: 5.17 10*6/uL (ref 4.20–5.82)
RDW: 13.5 % (ref 11.0–14.6)
WBC: 6.6 10*3/uL (ref 4.0–10.3)

## 2014-10-13 LAB — COMPREHENSIVE METABOLIC PANEL (CC13)
ALBUMIN: 4.6 g/dL (ref 3.5–5.0)
ALK PHOS: 68 U/L (ref 40–150)
ALT: 20 U/L (ref 0–55)
AST: 26 U/L (ref 5–34)
Anion Gap: 9 mEq/L (ref 3–11)
BILIRUBIN TOTAL: 0.69 mg/dL (ref 0.20–1.20)
BUN: 17.4 mg/dL (ref 7.0–26.0)
CO2: 29 mEq/L (ref 22–29)
Calcium: 10 mg/dL (ref 8.4–10.4)
Chloride: 104 mEq/L (ref 98–109)
Creatinine: 1 mg/dL (ref 0.7–1.3)
Glucose: 92 mg/dl (ref 70–140)
POTASSIUM: 4.4 meq/L (ref 3.5–5.1)
SODIUM: 141 meq/L (ref 136–145)
TOTAL PROTEIN: 7.7 g/dL (ref 6.4–8.3)

## 2014-10-13 LAB — CEA: CEA: 2.6 ng/mL (ref 0.0–5.0)

## 2014-10-13 MED ORDER — IOHEXOL 300 MG/ML  SOLN
100.0000 mL | Freq: Once | INTRAMUSCULAR | Status: AC | PRN
  Administered 2014-10-13: 100 mL via INTRAVENOUS

## 2014-10-20 ENCOUNTER — Telehealth: Payer: Self-pay | Admitting: Oncology

## 2014-10-20 ENCOUNTER — Ambulatory Visit (HOSPITAL_BASED_OUTPATIENT_CLINIC_OR_DEPARTMENT_OTHER): Payer: 59 | Admitting: Oncology

## 2014-10-20 VITALS — BP 128/79 | HR 74 | Temp 97.8°F | Resp 18 | Ht 72.0 in | Wt 191.6 lb

## 2014-10-20 DIAGNOSIS — C189 Malignant neoplasm of colon, unspecified: Secondary | ICD-10-CM

## 2014-10-20 DIAGNOSIS — C20 Malignant neoplasm of rectum: Secondary | ICD-10-CM

## 2014-10-20 NOTE — Telephone Encounter (Signed)
gv and printed appt sched and avs foer pt for NOV.Devon KitchenMarland Bray

## 2014-10-20 NOTE — Progress Notes (Signed)
Hematology and Oncology Follow Up Visit  Devon Bray [id number] [birth year] 48 y.o. 10/20/2014 8:29 AM   Principle Diagnosis: 48 year old with rectal cancer diagnosed in 2012. He had Stage III disease.    Prior Therapy:  He is S/P lower anterior resection on 12/10/2010 for 4.4 cm moderately differentiated carcinoma, 1/14 lymph nodes being positive. He is S/P external pelvic radiation with continuous 5- FU from 03/13/2011 to 03/10/2011. He is S/P XELOX beginning 05/14/2011 to 08/28/2011.   Current therapy: Observation and follow up.   Interim History: [name] presents today for a follow up visit. Since his last visit, he used to do well without any evidence of relapse. 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 He feels it more when he exercises or when he is on his feet for extended period of time. He still works full time without any decline. He reports erratic bowel movements with alternating constipation and occasional diarrhea but have been consistently like this since his operation. He has not reported any fevers or chills or sweats. He does not report any appetite changes or weight loss. Does not report any hematochezia or melena. He does not report any nausea, vomiting or early satiety. He does not report any chest pain, shortness of breath, hemoptysis or hematemesis. He does not report any frequency urgency or hesitancy. He does not report any skeletal complaints. Rest of his review of systems unremarkable.  Medications: I have reviewed the patient's current medications.  Current Outpatient Prescriptions  Medication Sig Dispense Refill  . fish oil-omega-3 fatty acids 1000 MG capsule Take 1 g by mouth daily.     . Multiple Vitamin (MULTIVITAMIN) tablet Take 1 tablet by mouth daily.    . Probiotic Product (ALIGN PO) Take 1 tablet by mouth daily.     No current facility-administered medications for this visit.    Allergies:   Allergies  Allergen Reactions  . Penicillins Rash and Other (See Comments)    Had when was a child    Past Medical History, Surgical history, Social history, and Family History were reviewed and updated.   Physical Exam: Blood pressure 128/79, pulse 74, temperature 97.8 F (36.6 C), temperature source Oral, resp. rate 18, height 6' (1.829 m), weight 191 lb 9.6 oz (86.909 kg), SpO2 100 %. ECOG: 0 General appearance: alert awake not in any distress. Head: Normocephalic, without obvious abnormality Neck: no adenopathy Lymph nodes: Cervical, supraclavicular, and axillary nodes normal. Heart:regular rate and rhythm, S1, S2 normal, no murmur, click, rub or gallop Lung:chest clear, no wheezing, rales, normal symmetric air entry Abdomin: soft, non-tender, without masses or organomegaly EXT:no erythema, induration, or nodules   Lab Results: Lab Results  Component Value Date   WBC 6.6 10/13/2014   HGB 15.4 10/13/2014   HCT 46.5 10/13/2014   MCV 90.1 10/13/2014   PLT 212 10/13/2014     Chemistry      Component Value Date/Time   NA 141 10/13/2014 0817   NA 141 02/27/2012 0752   NA 142 11/04/2011 0803   K 4.4 10/13/2014 0817   K 4.4 02/27/2012 0752   K 4.4 11/04/2011 0803   CL 103 01/28/2013 0757   CL 104 02/27/2012 0752   CL 100 11/04/2011 0803   CO2 29 10/13/2014 0817   CO2 28 02/27/2012 0752   CO2 30 11/04/2011 0803   BUN 17.4 10/13/2014 0817   BUN 19 02/27/2012 0752   BUN 16 11/04/2011 0803   CREATININE  1.0 10/13/2014 0817   CREATININE 0.93 02/27/2012 0752   CREATININE 0.9 11/04/2011 0803      Component Value Date/Time   CALCIUM 10.0 10/13/2014 0817   CALCIUM 9.3 02/27/2012 0752   CALCIUM 8.4 11/04/2011 0803   ALKPHOS 68 10/13/2014 0817   ALKPHOS 73 02/27/2012 0752   ALKPHOS 73 11/04/2011 0803   AST 26 10/13/2014 0817   AST 17 02/27/2012 0752   AST 27 11/04/2011 0803   ALT 20 10/13/2014 0817   ALT 17 02/27/2012 0752   ALT 28 11/04/2011 0803   BILITOT 0.69  10/13/2014 0817   BILITOT 0.5 02/27/2012 0752   BILITOT 0.70 11/04/2011 0803      Results for SABA, NEUMAN (MRN [id number]) as of 10/20/2014 08:34  Ref. Range 11/01/2013 08:16 10/13/2014 08:16  CEA Latest Range: 0.0-5.0 ng/mL 2.7 2.6     EXAM: CT CHEST, ABDOMEN, AND PELVIS WITH CONTRAST  TECHNIQUE: Multidetector CT imaging of the chest, abdomen and pelvis was performed following the standard protocol during bolus administration of intravenous contrast.  CONTRAST: 161mL OMNIPAQUE IOHEXOL 300 MG/ML SOLN  COMPARISON: CT 11/01/2013, 03/31/2011  FINDINGS: CT CHEST FINDINGS  No axillary supraclavicular lymphadenopathy. No mediastinal hilar lymphadenopathy. No pericardial fluid. No central pulmonary embolism. Review of the lung parenchyma demonstrates no suspicious pulmonary nodules.  CT ABDOMEN AND PELVIS FINDINGS  Hepatobiliary: No focal hepatic lesion. Gallbladder is normal.  Pancreas: Pancreas is normal. No ductal dilatation. No pancreatic inflammation.  Spleen: Small hypodense lesion in the spleen is unchanged measuring 9 mm on image 62.  Adrenals/Urinary Tract: Adrenal glands, kidneys, ureters, and bladder normal.  Stomach/Bowel: Stomach, small bowel, appendix, cecum normal. The colon and rectosigmoid colon are normal. There is a sigmoid anastomosis without evidence of obstruction or nodularity.  Vascular/Lymphatic: Abdominal aorta is normal caliber. There is no retroperitoneal or periportal lymphadenopathy. No pelvic lymphadenopathy.  There is a cluster a small lymph nodes in the ileocecal mesentery seen best on coronal images 43 through 47 of series 602. The largest node measures 10 mm short axis on image 46 of the coronal series. These are also seen on axial image 94 an 89. While these are not pathologic in size they do appear increased from prior.  Reproductive: Normal prostate gland. No free fluid the pelvis.  Other: No evidence  of peritoneal or omental metastatic disease. The central mesentery metastasis.  Musculoskeletal: No aggressive osseous lesion.  IMPRESSION: Chest Impression:  No evidence of thoracic metastasis  Abdomen / Pelvis Impression:  1. No evidence of abdominal or pelvic metastasis. 2. No evidence of local recurrence at the sigmoid anastomosis. 3. Increased prominence of small ileocecal mesenteric lymph nodes. Recommend close attention on follow-up.   Impression and Plan:  48 year old man with:   1. Rectal cancer, Stage III (with 1/14 lymph node positive). He is status post low anterior resection on 12/10/2010 followed by radiation therapy with chemotherapy. He received adjuvant chemotherapy in the form of Xeloda with oxaliplatin. He has been disease-free since that time. CT scan and laboratory data from 10/13/2014 were discussed today and showed no evidence of relapse of recurrent disease. He is close to 4 years out from his surgery and plan to continue with active surveillance with an annual followup and a CT scan. This will be done in 2016 and likely will not need any further imaging beyond that.  2. Colonoscopy screening: He will be due for his next colonoscopy in January of 2016.     [facility], MD 11/20/20158:29 AM

## 2015-09-18 ENCOUNTER — Telehealth: Payer: Self-pay | Admitting: Oncology

## 2015-09-18 NOTE — Telephone Encounter (Signed)
S/w pt re appt chg from 11/17 to 11/18. Gave aptp 11/18 @ 12p and advised 11/15 remain as scheduled. Pt verbalized understanding.

## 2015-10-16 ENCOUNTER — Other Ambulatory Visit (HOSPITAL_BASED_OUTPATIENT_CLINIC_OR_DEPARTMENT_OTHER): Payer: Commercial Managed Care - HMO

## 2015-10-16 ENCOUNTER — Ambulatory Visit (HOSPITAL_COMMUNITY)
Admission: RE | Admit: 2015-10-16 | Discharge: 2015-10-16 | Disposition: A | Discharge status: Home / Self Care | Payer: Commercial Managed Care - HMO | Source: Ambulatory Visit | Attending: Oncology | Admitting: Oncology

## 2015-10-16 DIAGNOSIS — C20 Malignant neoplasm of rectum: Secondary | ICD-10-CM | POA: Diagnosis not present

## 2015-10-16 DIAGNOSIS — C189 Malignant neoplasm of colon, unspecified: Secondary | ICD-10-CM | POA: Diagnosis present

## 2015-10-16 LAB — COMPREHENSIVE METABOLIC PANEL (CC13)
ALBUMIN: 4.2 g/dL (ref 3.5–5.0)
ALK PHOS: 64 U/L (ref 40–150)
ALT: 22 U/L (ref 0–55)
AST: 26 U/L (ref 5–34)
Anion Gap: 7 mEq/L (ref 3–11)
BILIRUBIN TOTAL: 0.7 mg/dL (ref 0.20–1.20)
BUN: 18.3 mg/dL (ref 7.0–26.0)
CALCIUM: 9.6 mg/dL (ref 8.4–10.4)
CO2: 28 mEq/L (ref 22–29)
CREATININE: 1 mg/dL (ref 0.7–1.3)
Chloride: 105 mEq/L (ref 98–109)
EGFR: >90 mL/min/{1.73_m2} (ref >90)
Glucose: 94 mg/dl (ref 70–140)
Potassium: 4.6 mEq/L (ref 3.5–5.1)
Sodium: 141 mEq/L (ref 136–145)
Total Protein: 7.2 g/dL (ref 6.4–8.3)

## 2015-10-16 LAB — CBC WITH DIFFERENTIAL/PLATELET
BASO%: 0.7 % (ref 0.0–2.0)
BASOS ABS: 0 10*3/uL (ref 0.0–0.1)
EOS%: 1.9 % (ref 0.0–7.0)
Eosinophils Absolute: 0.1 10*3/uL (ref 0.0–0.5)
HEMATOCRIT: 43.4 % (ref 38.4–49.9)
HEMOGLOBIN: 14.4 g/dL (ref 13.0–17.1)
LYMPH#: 1.5 10*3/uL (ref 0.9–3.3)
LYMPH%: 23.9 % (ref 14.0–49.0)
MCH: 29.7 pg (ref 27.2–33.4)
MCHC: 33.1 g/dL (ref 32.0–36.0)
MCV: 89.8 fL (ref 79.3–98.0)
MONO#: 0.7 10*3/uL (ref 0.1–0.9)
MONO%: 11.3 % (ref 0.0–14.0)
NEUT#: 4 10*3/uL (ref 1.5–6.5)
NEUT%: 62.2 % (ref 39.0–75.0)
Platelets: 204 10*3/uL (ref 140–400)
RBC: 4.84 10*6/uL (ref 4.20–5.82)
RDW: 13.5 % (ref 11.0–14.6)
WBC: 6.4 10*3/uL (ref 4.0–10.3)

## 2015-10-16 MED ORDER — IOHEXOL 300 MG/ML  SOLN
100.0000 mL | Freq: Once | INTRAMUSCULAR | Status: AC | PRN
  Administered 2015-10-16: 100 mL via INTRAVENOUS

## 2015-10-17 LAB — CEA: CEA: 2.5 ng/mL (ref 0.0–5.0)

## 2015-10-18 ENCOUNTER — Ambulatory Visit: Payer: 59 | Admitting: Oncology

## 2015-10-19 ENCOUNTER — Telehealth: Payer: Self-pay | Admitting: Oncology

## 2015-10-19 ENCOUNTER — Ambulatory Visit (HOSPITAL_BASED_OUTPATIENT_CLINIC_OR_DEPARTMENT_OTHER): Payer: Commercial Managed Care - HMO | Admitting: Oncology

## 2015-10-19 VITALS — BP 123/76 | HR 65 | Temp 98.4°F | Resp 18 | Ht 72.0 in | Wt 196.8 lb

## 2015-10-19 DIAGNOSIS — C20 Malignant neoplasm of rectum: Secondary | ICD-10-CM | POA: Diagnosis not present

## 2015-10-19 NOTE — Telephone Encounter (Signed)
Disregard previous message. Patient given new avs report and appointments for November 2017. Patient to follow up one year not 6 months. Labs dated one year - patient confirmed one year - desk nurse to also confirm.

## 2015-10-19 NOTE — Telephone Encounter (Signed)
Gave patient avs report and appointments for May 2017.  °

## 2015-10-19 NOTE — Progress Notes (Signed)
Hematology and Oncology Follow Up Visit  Devon Bray [id number] 1966-05-03 49 y.o. 10/19/2015 12:45 PM   Principle Diagnosis: 49 year old with rectal cancer diagnosed in 2012. He had Stage III disease.    Prior Therapy:  He is S/P lower anterior resection on 12/10/2010 for 4.4 cm moderately differentiated carcinoma, 1/14 lymph nodes being positive. He is S/P external pelvic radiation with continuous 5- FU from 03/13/2011 to 03/10/2011. He is S/P XELOX beginning 05/14/2011 to 08/28/2011.   Current therapy: Observation and follow up.   Interim History: Devon Bray presents today for a follow up visit. Since his last visit, he reports no recent complaints. He continues to be a baseline performance status and activity level. 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  His bowel habits have been irregular but have not changed since the last visit. He is up-to-date on his colonoscopies. He does not report any decline his energy her performance status.   He has not reported any fevers or chills or sweats. He does not report any appetite changes or weight loss. Does not report any hematochezia or melena. He does not report any nausea, vomiting or early satiety. He does not report any chest pain, shortness of breath, hemoptysis or hematemesis. He does not report any frequency urgency or hesitancy. He does not report any skeletal complaints. Rest of his review of systems unremarkable.  Medications: I have reviewed the patient's current medications.  Current Outpatient Prescriptions  Medication Sig Dispense Refill  . fish oil-omega-3 fatty acids 1000 MG capsule Take 1 g by mouth daily.     . Multiple Vitamin (MULTIVITAMIN) tablet Take 1 tablet by mouth daily.    . Probiotic Product (ALIGN PO) Take 1 tablet by mouth daily.     No current facility-administered medications for this visit.    Allergies:  Allergies  Allergen Reactions  . Penicillins  Rash and Other (See Comments)    Had when was a child    Past Medical History, Surgical history, Social history, and Family History were reviewed and updated.   Physical Exam: Blood pressure 123/76, pulse 65, temperature 98.4 F (36.9 C), temperature source Oral, resp. rate 18, height 6' (1.829 m), weight 196 lb 12.8 oz (89.268 kg), SpO2 99 %. ECOG: 0 General appearance: alert awake healthy-appearing Devon Bray without distress. Head: Normocephalic, without obvious abnormality no oral ulcers or lesions. Neck: no adenopathy Lymph nodes: Cervical, supraclavicular, and axillary nodes normal. Heart:regular rate and rhythm, S1, S2 normal, no murmur, click, rub or gallop Lung:chest clear, no wheezing, rales, normal symmetric air entry Abdomin: soft, non-tender, without masses or organomegaly EXT:no erythema, induration, or nodules   Lab Results: Lab Results  Component Value Date   WBC 6.4 10/16/2015   HGB 14.4 10/16/2015   HCT 43.4 10/16/2015   MCV 89.8 10/16/2015   PLT 204 10/16/2015     Chemistry      Component Value Date/Time   NA 141 10/16/2015 0744   NA 141 02/27/2012 0752   NA 142 11/04/2011 0803   K 4.6 10/16/2015 0744   K 4.4 02/27/2012 0752   K 4.4 11/04/2011 0803   CL 103 01/28/2013 0757   CL 104 02/27/2012 0752   CL 100 11/04/2011 0803   CO2 28 10/16/2015 0744   CO2 28 02/27/2012 0752   CO2 30 11/04/2011 0803   BUN 18.3 10/16/2015 0744   BUN 19 02/27/2012 0752   BUN 16 11/04/2011 0803   CREATININE 1.0 10/16/2015  0744   CREATININE 0.93 02/27/2012 0752   CREATININE 0.9 11/04/2011 0803      Component Value Date/Time   CALCIUM 9.6 10/16/2015 0744   CALCIUM 9.3 02/27/2012 0752   CALCIUM 8.4 11/04/2011 0803   ALKPHOS 64 10/16/2015 0744   ALKPHOS 73 02/27/2012 0752   ALKPHOS 73 11/04/2011 0803   AST 26 10/16/2015 0744   AST 17 02/27/2012 0752   AST 27 11/04/2011 0803   ALT 22 10/16/2015 0744   ALT 17 02/27/2012 0752   ALT 28 11/04/2011 0803   BILITOT 0.70  10/16/2015 0744   BILITOT 0.5 02/27/2012 0752   BILITOT 0.70 11/04/2011 0803      Results for Devon Bray, Devon Bray (MRN [id number]) as of 10/19/2015 12:46  Ref. Range 10/13/2014 08:16 10/16/2015 07:44  CEA Latest Ref Range: 0.0-5.0 ng/mL 2.6 2.5   EXAM: CT CHEST, ABDOMEN, AND PELVIS WITH CONTRAST  TECHNIQUE: Multidetector CT imaging of the chest, abdomen and pelvis was performed following the standard protocol during bolus administration of intravenous contrast.  CONTRAST: 166mL OMNIPAQUE IOHEXOL 300 MG/ML SOLN  COMPARISON: 10/13/2014  FINDINGS: CT CHEST FINDINGS  Mediastinum/Lymph Nodes: No masses, pathologically enlarged lymph nodes, or other significant abnormality.  Lungs/Pleura: No pulmonary mass, infiltrate, or effusion.  Musculoskeletal: No chest wall mass or suspicious bone lesions identified.  CT ABDOMEN PELVIS FINDINGS  Hepatobiliary: No masses or other significant abnormality.  Pancreas: No mass, inflammatory changes, or other significant abnormality.  Spleen: Within normal limits in size and appearance.  Adrenals/Urinary Tract: No masses identified. No evidence of hydronephrosis.  Stomach/Bowel: No evidence of obstruction, inflammatory process, or abnormal fluid collections.  Vascular/Lymphatic: No pathologically enlarged lymph nodes. Small right lower quadrant mesenteric nodes measuring up to 5 mm remain stable and are not pathologically enlarged. No evidence of abdominal aortic aneursym.  Reproductive: No mass or other significant abnormality.  Other: None.  Musculoskeletal: No suspicious bone lesions identified.  IMPRESSION: Stable exam. No evidence of recurrent or metastatic carcinoma within the chest, abdomen, or pelvis.      Impression and Plan:  49 year old man with:   1. Rectal cancer, Stage III (with 1/14 lymph node positive). He is status post low anterior resection on 12/10/2010 followed by radiation  therapy with chemotherapy. He received adjuvant chemotherapy in the form of Xeloda with oxaliplatin. He has been disease-free since that time. CT scan and laboratory data from 10/16/2015 were reviewed today and showed no evidence of recurrent disease. The plan is to repeat laboratory testing and physical exam in one year without any further imaging studies. The likelihood of cancer recurrence at this time is very well and he will not require any further oncology follow-up after 2017.  2. Colonoscopy screening: He is up-to-date at this point with his last colonoscopy done in January 2016.  3. Age-appropriate cancer screening: He does not have any significant family history of colon cancer. His father had prostate cancer and his mother had breast cancer. Although I think these cancers are sporadic, I advised him to continue to exercise age-appropriate cancer screening.  4. Follow-up: Will be in 12 months and likely no further therapy after that.    Zola Button, MD 11/18/201612:45 PM

## 2016-04-23 ENCOUNTER — Ambulatory Visit: Payer: Commercial Managed Care - HMO | Admitting: Oncology

## 2016-04-23 ENCOUNTER — Other Ambulatory Visit: Payer: Commercial Managed Care - HMO

## 2016-08-13 ENCOUNTER — Ambulatory Visit (INDEPENDENT_AMBULATORY_CARE_PROVIDER_SITE_OTHER): Payer: Commercial Managed Care - HMO | Admitting: Allergy

## 2016-08-13 ENCOUNTER — Encounter: Payer: Self-pay | Admitting: Allergy

## 2016-08-13 VITALS — BP 118/78 | HR 64 | Temp 97.9°F | Resp 18 | Ht 71.5 in | Wt 179.4 lb

## 2016-08-13 DIAGNOSIS — T783XXA Angioneurotic edema, initial encounter: Secondary | ICD-10-CM | POA: Diagnosis not present

## 2016-08-13 DIAGNOSIS — T6391XA Toxic effect of contact with unspecified venomous animal, accidental (unintentional), initial encounter: Secondary | ICD-10-CM

## 2016-08-13 DIAGNOSIS — L508 Other urticaria: Secondary | ICD-10-CM | POA: Diagnosis not present

## 2016-08-13 DIAGNOSIS — Z889 Allergy status to unspecified drugs, medicaments and biological substances status: Secondary | ICD-10-CM | POA: Diagnosis not present

## 2016-08-13 LAB — CBC WITH DIFFERENTIAL/PLATELET
BASOS ABS: 0 {cells}/uL (ref 0–200)
BASOS PCT: 0 %
EOS ABS: 62 {cells}/uL (ref 15–500)
EOS PCT: 1 %
HEMATOCRIT: 43.2 % (ref 38.5–50.0)
HEMOGLOBIN: 14.6 g/dL (ref 13.2–17.1)
LYMPHS ABS: 1922 {cells}/uL (ref 850–3900)
Lymphocytes Relative: 31 %
MCH: 30.3 pg (ref 27.0–33.0)
MCHC: 33.8 g/dL (ref 32.0–36.0)
MCV: 89.6 fL (ref 80.0–100.0)
MPV: 10.2 fL (ref 7.5–12.5)
Monocytes Absolute: 620 cells/uL (ref 200–950)
Monocytes Relative: 10 %
NEUTROS PCT: 58 %
Neutro Abs: 3596 cells/uL (ref 1500–7800)
Platelets: 217 10*3/uL (ref 140–400)
RBC: 4.82 MIL/uL (ref 4.20–5.80)
RDW: 13 % (ref 11.0–15.0)
WBC: 6.2 10*3/uL (ref 3.8–10.8)

## 2016-08-13 MED ORDER — RANITIDINE HCL 150 MG PO TABS: 150.0000 mg | ORAL_TABLET | Freq: Two times a day (BID) | ORAL | 5 refills | Status: DC

## 2016-08-13 NOTE — Patient Instructions (Addendum)
Hives and swelling  - We'll obtain lab work today: hive panel, CBC w diff, CMP, ESR, alpha-gal panel, tryptase  - Take Zyrtec84m daily        - if you still have hives/swelling add on Zantac 1574m       -if you are still having hives.swelling you can take Zyrtec and          Zantac twice a day  - Let usKoreanow if you have any of these features with hives/swelling:     Fever, joint pain, marks or bruises left after hive has resolved  Drug allergy  - Continue avoidance of penicillin based antibiotics  - Once a your hives and swelling have resolved recommend penicillin testing and challenge to determine if you have true allergy  Venom allergy  - Given your previous reactions to stings would recommend venom testing.  Again would want hives and swelling resolved before testing.  - Have access to EpiPen  Follow-up in 2-3 months

## 2016-08-13 NOTE — Progress Notes (Signed)
New Patient Note  RE: Devon Bray MRN: [id number] DOB: 09-28-1966 Date of Office Visit: 08/13/2016  Referring provider: Shirline Frees, MD Primary care provider: Shirline Frees, MD  Chief Complaint: Allergic reactions   History of present illness: Devon Bray is a 50 y.o. male presenting today for consultation for allergic reaction.  He has been having 'allergic reactions'.  He reports having large red, raisedblotches that pop up randomly on his body (arms, trunk mostly) that are itchy.  He provided a picture of one of his larger areas that is consistent with a hive. He has also had lip swelling.  One morning he states he felt like his throat was starting to close.  He went to his PCP who told him to take daily Zyrtec and prescribed epipen.   Since he has been taking daily Zyrtec he has had reduction in the frequency of his hives. Hives started about 6 weeks ago.  He reports almost near daily symptoms.  He denies any preceding illnesses, no fevers, no joint pain/arthralgia, no new foods, no changes in his medications, no change in social she detergents.  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 The only thing he says he can think of is that he has been cleaning off a wooded lot and has been bitten by several ticks.  She states he has not noticed any issues following any red meat or dairy ingestion. However she does state that the high levels and swelling. Usually noted when he wakes up in the morning. He denies any cough/wheezing/shortness of breath, no nausea or vomiting, no lightheadedness or syncope.  No nasal or ocular symptoms suggestive of allergic rhinoconjunctivitis.  No asthma or eczema history. No previous food allergy.  He was stung with nest of yellow jackets about 7-8 years ago with local pain and swelling and mouth swelling.  Now when he gets stung he reports he has lip and tongue swelling.  He reports he has difficulty  breathing when he had the tongue swelling.    Reports a PCN allergy as a child.  He was told by parents to avoid.  She believes he had a rash.  Review of systems: Review of Systems  Constitutional: Negative for chills, fever and malaise/fatigue.  HENT: Negative for congestion and sore throat.   Eyes: Negative for redness.  Respiratory: Negative for cough, shortness of breath and wheezing.   Cardiovascular: Negative for chest pain.  Gastrointestinal: Negative for diarrhea, nausea and vomiting.  Skin: Positive for itching and rash.  Neurological: Negative for headaches.    All other systems negative unless noted above in HPI  Past medical history: Past Medical History:  Diagnosis Date  . Cancer ([place]) 12/10/10   rectal  . Hx of radiation therapy 02/10/11 to 03/20/11   rectum  . Inguinal hernia   . Rectal cancer ([place])    rectal ca dx 10/2010    Past surgical history: Past Surgical History:  Procedure Laterality Date  . insert port a cath    . PORT-A-CATH REMOVAL  12/22/2011   Procedure: REMOVAL PORT-A-CATH;  Surgeon: Joyice Faster. Cornett, MD;  Location: [facility];  Service: General;  Laterality: N/A;  . RECTAL SURGERY    . thrombosed hemorroid      Family history:  Family History  Problem Relation Age of Onset  . Cancer Father     prostate  . Cancer Paternal Uncle 85    colon  .  Lymphoma Maternal Aunt   . Lung cancer Maternal Grandmother   . Cancer Maternal Grandmother     lung  . Cancer Paternal Aunt     lymphoma  . Cancer Maternal Aunt     lung  He denies any family history of urticaria or angioedema or history of recurrent anaphylaxis  Social history: Social History  . Marital status: Married   Social History Main Topics  . Smoking status: Never Smoker  . Smokeless tobacco: Not on file  . Alcohol use No    Social History Narrative  . She lives in a home with carpeting in the bedroom. Home has electric heating and cooling. He has a dog in the home. They do have  dust mite covers for there pillow in bed. He works as an Sales promotion account executive.     Medication List:   Medication List       Accurate as of 08/13/16  9:29 AM. Always use your most recent med list.          ALIGN PO Take 1 tablet by mouth daily.   EPINEPHrine 0.3 mg/0.3 mL Soaj injection Commonly known as:  EPI-PEN Inject 0.3 mg into the muscle once.   fish oil-omega-3 fatty acids 1000 MG capsule Take 1 g by mouth daily.   multivitamin tablet Take 1 tablet by mouth daily.   omeprazole 40 MG capsule Commonly known as:  PRILOSEC Take 40 mg by mouth as needed.   ZYRTEC ALLERGY 10 MG tablet Generic drug:  cetirizine Take 10 mg by mouth daily.       Known medication allergies: Allergies  Allergen Reactions  . Bee Venom   . Penicillins Rash and Other (See Comments)    Had when was a child     Physical examination: Blood pressure 118/78, pulse 64, temperature 97.9 F (36.6 C), temperature source Oral, resp. rate 18, height 5' 11.5" (1.816 m), weight 179 lb 6.4 oz (81.4 kg).  General: Alert, interactive, in no acute distress. HEENT: TMs pearly gray, turbinates non-edematous without discharge, post-pharynx non erythematous. Neck: Supple without lymphadenopathy. Lungs: Clear to auscultation without wheezing, rhonchi or rales. {no increased work of breathing. CV: Normal S1, S2 without murmurs. Abdomen: Nondistended, nontender. Skin: 1 erythematous urticarial type lesions primarily located Right forearm , nonvesicular. Extremities:  No clubbing, cyanosis or edema. Neuro:   Grossly intact.  Diagnositics/Labs: Allergy testing: Deferred due to urticaria   Assessment and plan:   Urticaria with angioedema  - Likely idiopathic in nature. Somewhat concerned for possible alpha gal given his history of tick bite.  - We'll obtain lab work today: hive panel, CBC w diff, CMP, ESR, alpha-gal panel, tryptase  - We'll take a stepwise approach with his treatment as he feels  largely controlled with only daily Zyrtec         -Take Zyrtec84m daily        - if you still have hives/swelling add on Zantac 151m        -if you are still having hives/swelling take Zyrtec and Zantac twice a day  - Let usKoreanow if you have any of these features with hives/swelling: Fever, joint pain, marks or bruises left after hive has resolved  Drug allergy  - Continue avoidance of penicillin based antibiotics  - Once a your hives and swelling have resolved recommend penicillin testing and challenge to determine if you have true allergy  Venom allergy  - Given your previous reactions to stings would recommend venom testing.  Again would want hives and swelling resolved before testing.  - Have access to EpiPen  Follow-up in 2-3 months   I appreciate the opportunity to take part in Alexsandro's care. Please do not hesitate to contact me with questions.  Sincerely,   Prudy Feeler, MD Allergy/Immunology [facility] [place]

## 2016-08-14 LAB — COMPREHENSIVE METABOLIC PANEL
ALBUMIN: 4.6 g/dL (ref 3.6–5.1)
ALK PHOS: 59 U/L (ref 40–115)
ALT: 18 U/L (ref 9–46)
AST: 18 U/L (ref 10–35)
BILIRUBIN TOTAL: 0.5 mg/dL (ref 0.2–1.2)
BUN: 17 mg/dL (ref 7–25)
CALCIUM: 9.5 mg/dL (ref 8.6–10.3)
CO2: 27 mmol/L (ref 20–31)
CREATININE: 0.91 mg/dL (ref 0.70–1.33)
Chloride: 102 mmol/L (ref 98–110)
Glucose, Bld: 89 mg/dL (ref 65–99)
Potassium: 4.4 mmol/L (ref 3.5–5.3)
SODIUM: 139 mmol/L (ref 135–146)
Total Protein: 7.1 g/dL (ref 6.1–8.1)

## 2016-08-14 LAB — SEDIMENTATION RATE: Sed Rate: 1 mm/hr (ref 0–20)

## 2016-08-15 LAB — TRYPTASE: TRYPTASE: 3.9 ug/L (ref <11)

## 2016-08-16 LAB — ALPHA-GAL PANEL
Allergen, Mutton, f88: <0.1 kU/L
Allergen, Pork, f26: 0.18 kU/L — ABNORMAL HIGH
BEEF: 0.43 kU/L — AB
GALACTOSE-ALPHA-1, 3-GALACTOSE IGE: 4.26 kU/L — AB (ref <0.35)

## 2016-08-19 ENCOUNTER — Telehealth: Payer: Self-pay

## 2016-08-19 NOTE — Telephone Encounter (Signed)
Patient wanting results of labs drawn 08/13/16 at [facility]. One lab is pending : Urticaria Panel per [facility].  Called patient and informed labs pending and he will get a call once Dr. Nelva Bush has reviewed results. Patient ok to wait.

## 2016-08-20 ENCOUNTER — Telehealth: Payer: Self-pay

## 2016-08-20 LAB — CP CHRONIC URTICARIA INDEX PANEL
TSH: 0.81 m[IU]/L (ref 0.40–4.50)
Thyroglobulin Ab: <1 IU/mL (ref <2)
Thyroperoxidase Ab SerPl-aCnc: 13 IU/mL — ABNORMAL HIGH (ref <9)

## 2016-08-20 NOTE — Telephone Encounter (Signed)
Called patient.  Urticaria Index Panel still pending per lab.  Informed patient we will call him when Dr. Nelva Bush receives and reviews labs.

## 2016-08-20 NOTE — Telephone Encounter (Signed)
Patient calling to get an update on his lab results.   Please Advise

## 2016-10-13 ENCOUNTER — Ambulatory Visit (INDEPENDENT_AMBULATORY_CARE_PROVIDER_SITE_OTHER): Payer: Commercial Managed Care - HMO | Admitting: Allergy

## 2016-10-13 ENCOUNTER — Encounter: Payer: Self-pay | Admitting: Allergy

## 2016-10-13 VITALS — BP 112/70 | HR 72 | Resp 16

## 2016-10-13 DIAGNOSIS — L500 Allergic urticaria: Secondary | ICD-10-CM

## 2016-10-13 DIAGNOSIS — Z91018 Allergy to other foods: Secondary | ICD-10-CM

## 2016-10-13 DIAGNOSIS — T783XXD Angioneurotic edema, subsequent encounter: Secondary | ICD-10-CM

## 2016-10-13 NOTE — Progress Notes (Signed)
Follow-up Note  RE: Devon Bray MRN: [id number] DOB: 11/16/1966 Date of Office Visit: 10/13/2016   History of present illness: Devon Bray is a 50 y.o. male presenting today for follow-up of hives and swelling. He was last seen in our office by myself in September for initial visit. Since his last visit he has not had any further hives or swelling episodes. He has been taking an over-the-counter allergy medicine as well as Zantac.  He has been trying to stay away from red meats however he does recall eating some beef chili and another dish that contained pork and he did not have any issues. She has an EpiPen.     Review of systems: Review of Systems  Constitutional: Negative for chills, fever and malaise/fatigue.  HENT: Positive for nosebleeds. Negative for congestion and sore throat.   Eyes: Negative for redness.  Respiratory: Negative for cough, shortness of breath and wheezing.   Cardiovascular: Negative for chest pain.  Gastrointestinal: Positive for constipation. Negative for heartburn and nausea.  Skin: Negative for itching and rash.    All other systems negative unless noted above in HPI  Past medical/social/surgical/family history have been reviewed and are unchanged unless specifically indicated below.  No changes  Medication List:   Medication List       Accurate as of 10/13/16 10:39 AM. Always use your most recent med list.          ALIGN PO Take 1 tablet by mouth daily.   EPINEPHrine 0.3 mg/0.3 mL Soaj injection Commonly known as:  EPI-PEN Inject 0.3 mg into the muscle once.   fish oil-omega-3 fatty acids 1000 MG capsule Take 1 g by mouth daily.   multivitamin tablet Take 1 tablet by mouth daily.   omeprazole 40 MG capsule Commonly known as:  PRILOSEC Take 40 mg by mouth as needed.   ranitidine 150 MG tablet Commonly known as:  ZANTAC Take 1 tablet (150 mg total) by mouth 2 (two) times daily.   ZYRTEC ALLERGY 10 MG tablet Generic  drug:  cetirizine Take 10 mg by mouth daily.       Known medication allergies: Allergies  Allergen Reactions  . Bee Venom   . Beef-Derived Products   . Pork-Derived Products   . Penicillins Rash and Other (See Comments)    Had when was a child     Physical examination: Blood pressure 112/70, pulse 72, resp. rate 16.  General: Alert, interactive, in no acute distress. HEENT: TMs pearly gray, turbinates minimally edematous without discharge, post-pharynx non erythematous. Neck: Supple without lymphadenopathy. Lungs: Clear to auscultation without wheezing, rhonchi or rales. {no increased work of breathing. CV: Normal S1, S2 without murmurs. Abdomen: Nondistended, nontender. Skin: Warm and dry, without lesions or rashes. Extremities:  No clubbing, cyanosis or edema. Neuro:   Grossly intact.  Diagnositics/Labs: Labs:  Component     Latest Ref Rng & Units 08/13/2016  WBC     3.8 - 10.8 K/uL 6.2  RBC     4.20 - 5.80 MIL/uL 4.82  Hemoglobin     13.2 - 17.1 g/dL 14.6  HCT     38.5 - 50.0 % 43.2  MCV     80.0 - 100.0 fL 89.6  MCH     27.0 - 33.0 pg 30.3  MCHC     32.0 - 36.0 g/dL 33.8  RDW     11.0 - 15.0 % 13.0  Platelets     140 - 400 K/uL 217  MPV     7.5 - 12.5 fL 10.2  NEUT#     1,500 - 7,800 cells/uL 3,596  Lymphocyte #     850 - 3,900 cells/uL 1,922  Monocyte #     200 - 950 cells/uL 620  Eosinophils Absolute     15 - 500 cells/uL 62  Basophils Absolute     0 - 200 cells/uL 0  Neutrophils     % 58  Lymphocytes     % 31  Monocytes Relative     % 10  Eosinophil     % 1  Basophil     % 0  Smear Review      Criteria for review not met  Sodium     135 - 146 mmol/L 139  Potassium     3.5 - 5.3 mmol/L 4.4  Chloride     98 - 110 mmol/L 102  CO2     20 - 31 mmol/L 27  Glucose     65 - 99 mg/dL 89  BUN     7 - 25 mg/dL 17  Creatinine     0.70 - 1.33 mg/dL 0.91  Total Bilirubin     0.2 - 1.2 mg/dL 0.5  Alkaline Phosphatase     40 - 115 U/L  59  AST     10 - 35 U/L 18  ALT     9 - 46 U/L 18  Total Protein     6.1 - 8.1 g/dL 7.1  Albumin     3.6 - 5.1 g/dL 4.6  Calcium     8.6 - 10.3 mg/dL 9.5  Beef     kU/L 0.43 (H)  Allergen, Pork, f26     kU/L 0.18 (H)  Allergen, Mutton, f88     kU/L <0.10  Galactose-alpha-1,3-galactose IgE     <0.35 kU/L 4.26 (H)  TSH     0.40 - 4.50 mIU/L 0.81  Thyroperoxidase Ab SerPl-aCnc     <9 IU/mL 13 (H)  Thyroglobulin Ab     <2 IU/mL <1  Histamine Release     <16 % <16  Sed Rate     0 - 20 mm/hr 1  Tryptase     <11 ug/L 3.9   Assessment and plan: Urticaria and Angioedema  - currently doing well without any symptoms  - would stop Zantac at this time.  If hives and swelling free after about 3 weeks can stop Zyrtec (or your allergy medication).    - if your hives/swelling return start back Zyrtec and Zantac to take twice a day - Let us know if you have any of these features with hives/swelling: Fever, joint pain, marks or bruises left after hive has resolved  Alpha-gal allergy  - continue avoidance of red meats   - have access to your Epipen for use as needed for allergic reaction  - will plan to obtain repeat alpha-gal panel in a year (around September 2018).     Follow-up in 1 yr or sooner if needed  I appreciate the opportunity to take part in Devon Bray's care. Please do not hesitate to contact me with questions.  Sincerely,   Prudy Feeler, MD Allergy/Immunology [facility] [place]

## 2016-10-13 NOTE — Patient Instructions (Signed)
Hives and swelling  - currently doing well without any symptoms  - would stop Zantac at this time.  If hive and swelling free after about 3 weeks can stop Zyrtec (or your allergy medication)    - if your  hives/swelling return start back Zyrtec and Zantac to take twice a day - Let us know if you have any of these features with hives/swelling:     Fever, joint pain, marks or bruises left after hive has resolved   - continue avoidance of red meats   - have access to your Epipen for use as needed for allergic reaction  - will plan to obtain repeat alpha-gal panel in a year (around September 2018).     Follow-up in 1 yr or sooner if needed

## 2016-10-17 ENCOUNTER — Ambulatory Visit (HOSPITAL_BASED_OUTPATIENT_CLINIC_OR_DEPARTMENT_OTHER): Payer: Commercial Managed Care - HMO | Admitting: Oncology

## 2016-10-17 ENCOUNTER — Other Ambulatory Visit (HOSPITAL_BASED_OUTPATIENT_CLINIC_OR_DEPARTMENT_OTHER): Payer: Commercial Managed Care - HMO

## 2016-10-17 VITALS — BP 126/75 | HR 56 | Temp 98.3°F | Resp 18 | Wt 183.8 lb

## 2016-10-17 DIAGNOSIS — C20 Malignant neoplasm of rectum: Secondary | ICD-10-CM | POA: Diagnosis not present

## 2016-10-17 DIAGNOSIS — Z85048 Personal history of other malignant neoplasm of rectum, rectosigmoid junction, and anus: Secondary | ICD-10-CM | POA: Diagnosis not present

## 2016-10-17 LAB — COMPREHENSIVE METABOLIC PANEL
ALBUMIN: 4 g/dL (ref 3.5–5.0)
ALK PHOS: 66 U/L (ref 40–150)
ALT: 21 U/L (ref 0–55)
ANION GAP: 11 meq/L (ref 3–11)
AST: 21 U/L (ref 5–34)
BUN: 22.1 mg/dL (ref 7.0–26.0)
CALCIUM: 9.5 mg/dL (ref 8.4–10.4)
CO2: 25 mEq/L (ref 22–29)
Chloride: 104 mEq/L (ref 98–109)
Creatinine: 1 mg/dL (ref 0.7–1.3)
Glucose: 86 mg/dl (ref 70–140)
POTASSIUM: 4.1 meq/L (ref 3.5–5.1)
Sodium: 139 mEq/L (ref 136–145)
Total Bilirubin: 0.57 mg/dL (ref 0.20–1.20)
Total Protein: 7.5 g/dL (ref 6.4–8.3)

## 2016-10-17 LAB — CBC WITH DIFFERENTIAL/PLATELET
BASO%: 0.2 % (ref 0.0–2.0)
BASOS ABS: 0 10*3/uL (ref 0.0–0.1)
EOS%: 2.1 % (ref 0.0–7.0)
Eosinophils Absolute: 0.1 10*3/uL (ref 0.0–0.5)
HEMATOCRIT: 41.8 % (ref 38.4–49.9)
HEMOGLOBIN: 14.2 g/dL (ref 13.0–17.1)
LYMPH#: 1.8 10*3/uL (ref 0.9–3.3)
LYMPH%: 32.2 % (ref 14.0–49.0)
MCH: 30.1 pg (ref 27.2–33.4)
MCHC: 34 g/dL (ref 32.0–36.0)
MCV: 88.7 fL (ref 79.3–98.0)
MONO#: 0.6 10*3/uL (ref 0.1–0.9)
MONO%: 10.5 % (ref 0.0–14.0)
NEUT#: 3.2 10*3/uL (ref 1.5–6.5)
NEUT%: 55 % (ref 39.0–75.0)
PLATELETS: 205 10*3/uL (ref 140–400)
RBC: 4.71 10*6/uL (ref 4.20–5.82)
RDW: 13 % (ref 11.0–14.6)
WBC: 5.7 10*3/uL (ref 4.0–10.3)

## 2016-10-17 LAB — CEA (IN HOUSE-CHCC): CEA (CHCC-In House): 3.32 ng/mL (ref 0.00–5.00)

## 2016-10-17 NOTE — Progress Notes (Signed)
Hematology and Oncology Follow Up Visit  Devon Bray [id number] [birth year] 50 y.o. 10/17/2016 8:19 AM   Principle Diagnosis: 50 year old with rectal cancer diagnosed in 2012. He had Stage III disease.    Prior Therapy:  He is S/P lower anterior resection on 12/10/2010 for 4.4 cm moderately differentiated carcinoma, 1/14 lymph nodes being positive. He is S/P external pelvic radiation with continuous 5- FU from 03/13/2011 to 03/10/2011. He is S/P XELOX beginning 05/14/2011 to 08/28/2011.    Current therapy: Observation and follow up.   Interim History: Devon Bray presents today for a follow up visit. 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 This does not interfere with his ability to perform activities of daily living. He continues to work full time and his quality of life is unchanged. He denied any GI symptoms including abdominal pain, distention, hematochezia or melena. He is up-to-date on his colonoscopies.   He has not reported any fevers or chills or sweats. He does not report any appetite changes or weight loss. Does not report any hematochezia or melena. He does not report any nausea, vomiting or early satiety. He does not report any chest pain, shortness of breath, hemoptysis or hematemesis. He does not report any frequency urgency or hesitancy. He does not report any skeletal complaints. Rest of his review of systems unremarkable.  Medications: I have reviewed the patient's current medications.  Current Outpatient Prescriptions  Medication Sig Dispense Refill  . cetirizine (ZYRTEC ALLERGY) 10 MG tablet Take 10 mg by mouth daily.    Marland Kitchen EPINEPHrine 0.3 mg/0.3 mL IJ SOAJ injection Inject 0.3 mg into the muscle once.    . fish oil-omega-3 fatty acids 1000 MG capsule Take 1 g by mouth daily.     . Multiple Vitamin (MULTIVITAMIN) tablet Take 1 tablet by mouth daily.    Marland Kitchen omeprazole  (PRILOSEC) 40 MG capsule Take 40 mg by mouth as needed.    . Probiotic Product (ALIGN PO) Take 1 tablet by mouth daily.    . ranitidine (ZANTAC) 150 MG tablet Take 1 tablet (150 mg total) by mouth 2 (two) times daily. (Patient not taking: Reported on 10/13/2016) 30 tablet 5   No current facility-administered medications for this visit.     Allergies:  Allergies  Allergen Reactions  . Bee Venom   . Beef-Derived Products   . Pork-Derived Products   . Penicillins Rash and Other (See Comments)    Had when was a child    Past Medical History, Surgical history, Social history, and Family History were reviewed and updated.   Physical Exam: Blood pressure 126/75, pulse (!) 56, temperature 98.3 F (36.8 C), temperature source Oral, resp. rate 18, weight 183 lb 12.8 oz (83.4 kg), SpO2 100 %. ECOG: 0 General appearance: Alert and awake gentleman without distress. Head: Normocephalic, without obvious abnormality no oral thrush noted. Neck: no adenopathy Lymph nodes: Cervical, supraclavicular, and axillary nodes normal. Heart:regular rate and rhythm, S1, S2 normal, no murmur, click, rub or gallop Lung:chest clear, no wheezing, rales, normal symmetric air entry Abdomin: soft, non-tender, without masses or organomegaly no shifting dullness or ascites. EXT:no erythema, induration, or nodules   Lab Results: Lab Results  Component Value Date   WBC 5.7 10/17/2016   HGB 14.2 10/17/2016   HCT 41.8 10/17/2016   MCV 88.7 10/17/2016   PLT 205 10/17/2016     Chemistry      Component Value  Date/Time   NA 139 08/13/2016 1015   NA 141 10/16/2015 0744   K 4.4 08/13/2016 1015   K 4.6 10/16/2015 0744   CL 102 08/13/2016 1015   CL 103 01/28/2013 0757   CO2 27 08/13/2016 1015   CO2 28 10/16/2015 0744   BUN 17 08/13/2016 1015   BUN 18.3 10/16/2015 0744   CREATININE 0.91 08/13/2016 1015   CREATININE 1.0 10/16/2015 0744      Component Value Date/Time   CALCIUM 9.5 08/13/2016 1015   CALCIUM  9.6 10/16/2015 0744   ALKPHOS 59 08/13/2016 1015   ALKPHOS 64 10/16/2015 0744   AST 18 08/13/2016 1015   AST 26 10/16/2015 0744   ALT 18 08/13/2016 1015   ALT 22 10/16/2015 0744   BILITOT 0.5 08/13/2016 1015   BILITOT 0.70 10/16/2015 0744       EXAM: CT CHEST, ABDOMEN, AND PELVIS WITH CONTRAST  TECHNIQUE: Multidetector CT imaging of the chest, abdomen and pelvis was performed following the standard protocol during bolus administration of intravenous contrast.  CONTRAST: 157mL OMNIPAQUE IOHEXOL 300 MG/ML SOLN  COMPARISON: 10/13/2014  FINDINGS: CT CHEST FINDINGS  Mediastinum/Lymph Nodes: No masses, pathologically enlarged lymph nodes, or other significant abnormality.  Lungs/Pleura: No pulmonary mass, infiltrate, or effusion.  Musculoskeletal: No chest wall mass or suspicious bone lesions identified.  CT ABDOMEN PELVIS FINDINGS  Hepatobiliary: No masses or other significant abnormality.  Pancreas: No mass, inflammatory changes, or other significant abnormality.  Spleen: Within normal limits in size and appearance.  Adrenals/Urinary Tract: No masses identified. No evidence of hydronephrosis.  Stomach/Bowel: No evidence of obstruction, inflammatory process, or abnormal fluid collections.  Vascular/Lymphatic: No pathologically enlarged lymph nodes. Small right lower quadrant mesenteric nodes measuring up to 5 mm remain stable and are not pathologically enlarged. No evidence of abdominal aortic aneursym.  Reproductive: No mass or other significant abnormality.  Other: None.  Musculoskeletal: No suspicious bone lesions identified.  IMPRESSION: Stable exam. No evidence of recurrent or metastatic carcinoma within the chest, abdomen, or pelvis.      Impression and Plan:  50 year old man with:   1. Rectal cancer, Stage III (with 1/14 lymph node positive). He is status post low anterior resection on 12/10/2010 followed by  radiation therapy with chemotherapy. He received adjuvant chemotherapy in the form of Xeloda with oxaliplatin. He Continues to be disease free since that time.   CT scan from 10/16/2015 showed no evidence of recurrent disease. His laboratory data and physical examination continues to show no evidence of recurrent disease.  At this time, he is over 5 years out from his surgery and adjuvant chemotherapy. I see no need for further oncology follow-up at this time as the chance of recurrence is extremely low moving forward.  2. Colonoscopy screening: He is up-to-date at this point with his last colonoscopy done in January 2016. I recommended continuing with colonoscopy every 5 years or longer depending on the results of his next colonoscopy.  3. Age-appropriate cancer screening: He does not have any significant family history of colon cancer. His father had prostate cancer and his mother had breast cancer. He gets her routine annual screening with his primary care provider.  4. Follow-up: Am happy to see him in the future as needed.    [facility], MD 11/17/20178:19 AM

## 2016-10-18 LAB — CEA (PARALLEL TESTING): CEA: 1.9 ng/mL

## 2016-10-19 IMAGING — CT CT ABD-PELV W/ CM
2 of 6 series · 14 of 46 positions shown, 19 images · IV contrast (OMNIPAQUE)
Comparison: 10/13/2014

CLINICAL DATA: Followup colorectal carcinoma. Previous surgery,
chemotherapy, and radiation therapy.

EXAM:
CT CHEST, ABDOMEN, AND PELVIS WITH CONTRAST
TECHNIQUE: Multidetector CT imaging of the chest, abdomen and pelvis was
performed following the standard protocol during bolus
administration of intravenous contrast.
CONTRAST:  100mL OMNIPAQUE IOHEXOL 300 MG/ML  SOLN

[Series 2: cap with st · axial · 0.88mm/px · z∈[-666,-50]mm · 11 of 141 slices shown, 16 images]
[im 9/141  soft-tissue]
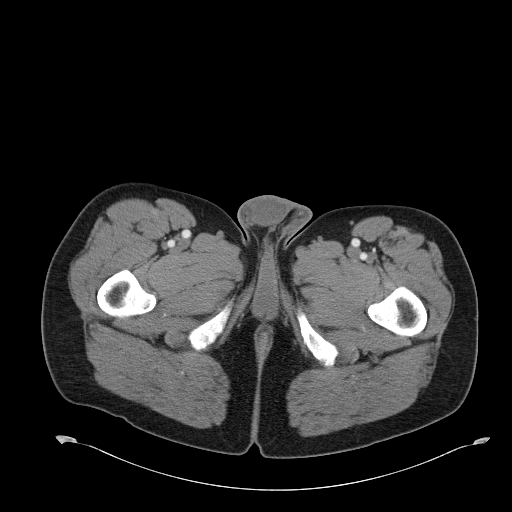
[im 9/141  bone]
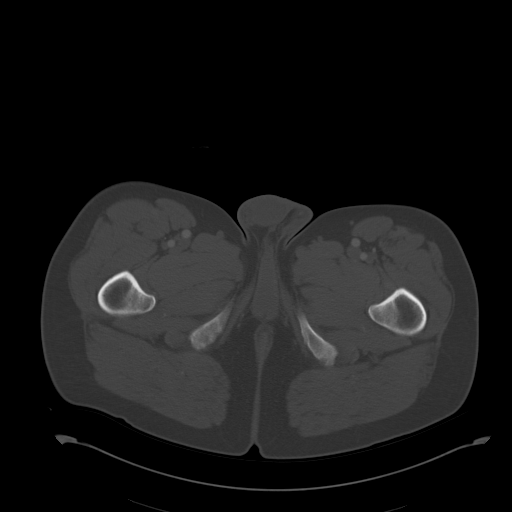
[im 25/141  soft-tissue]
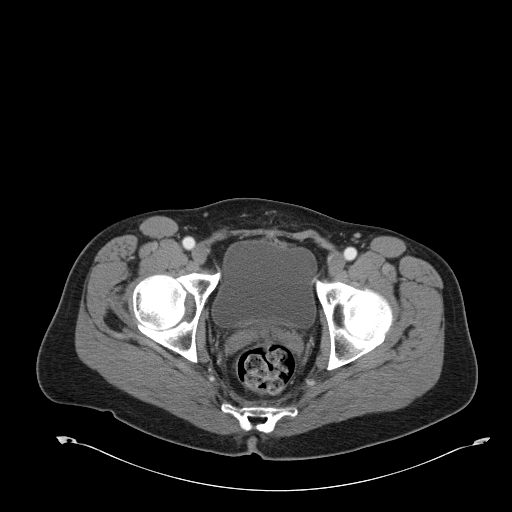
[im 42/141  soft-tissue]
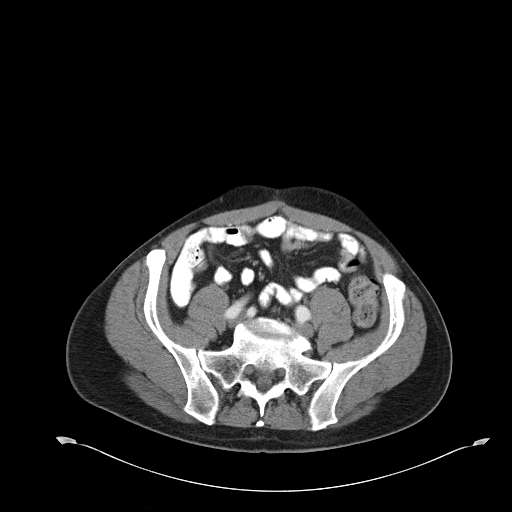
[im 50/141  soft-tissue]
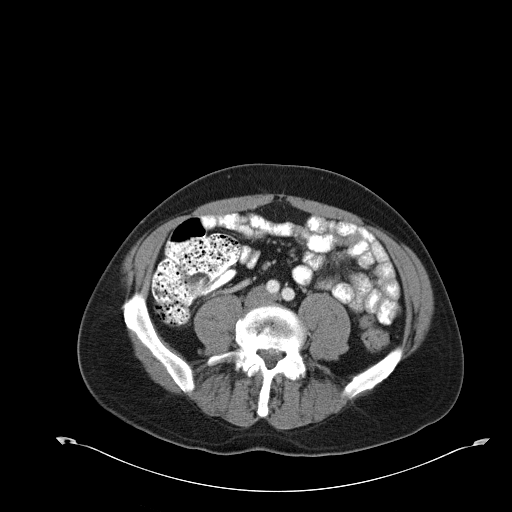
[im 66/141  soft-tissue]
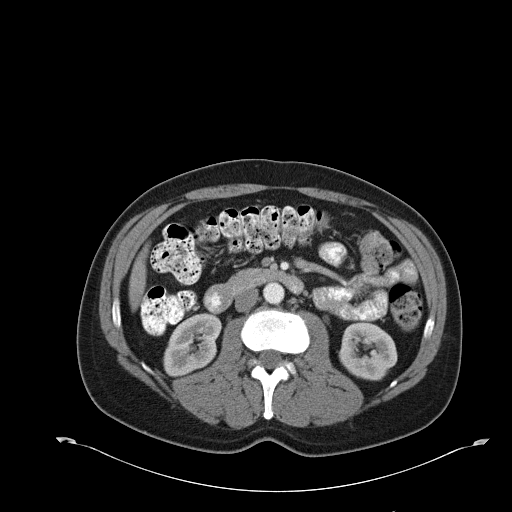
[im 75/141  soft-tissue]
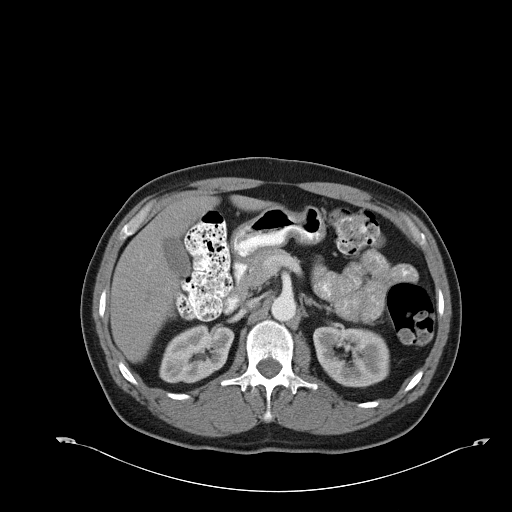
[im 91/141  soft-tissue]
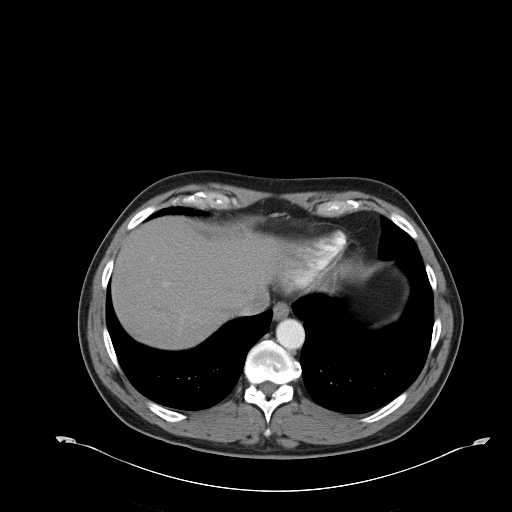
[im 108/141  soft-tissue]
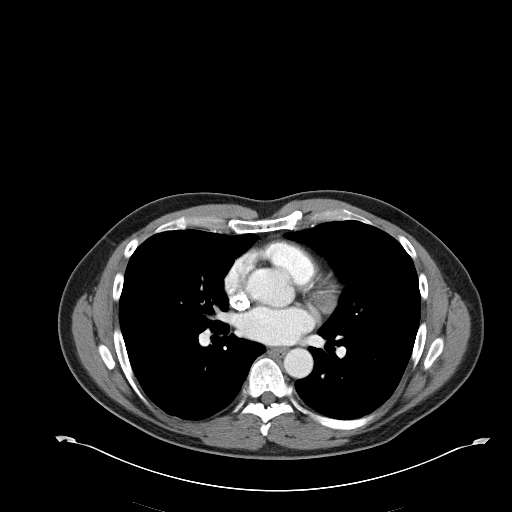
[im 108/141  lung]
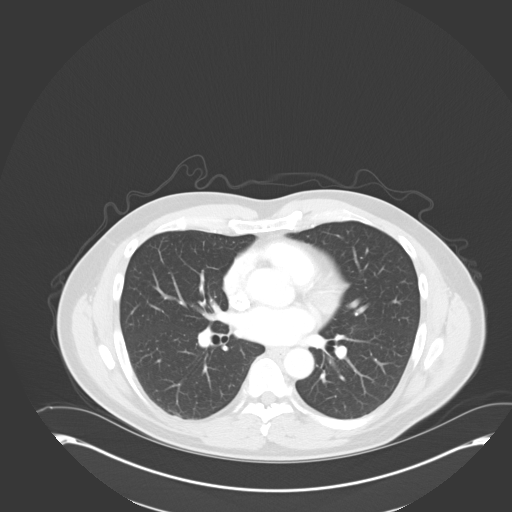
[im 116/141  soft-tissue]
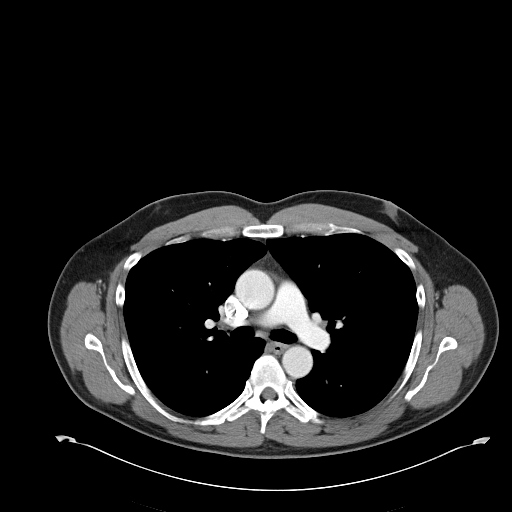
[im 116/141  lung]
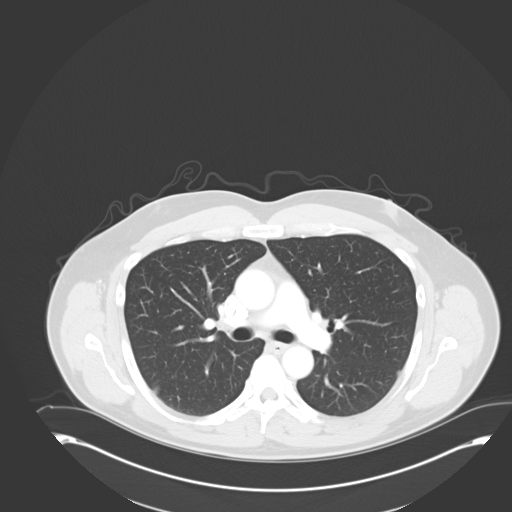
[im 116/141  bone]
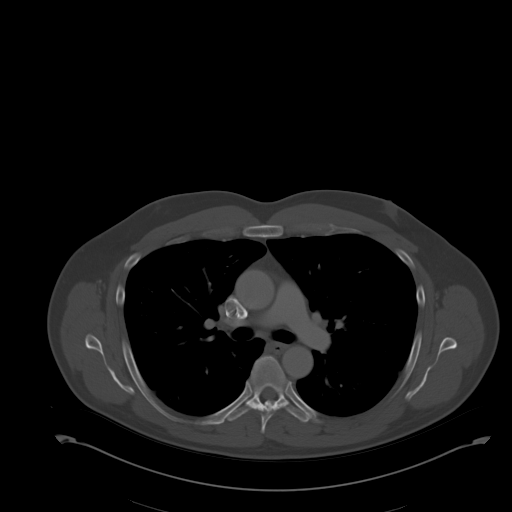
[im 124/141  lung]
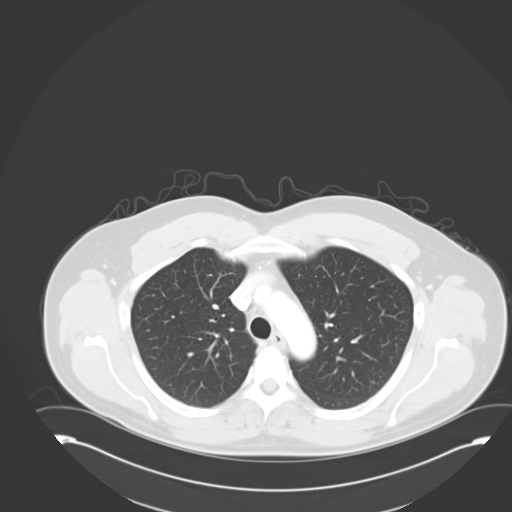
[im 132/141  soft-tissue]
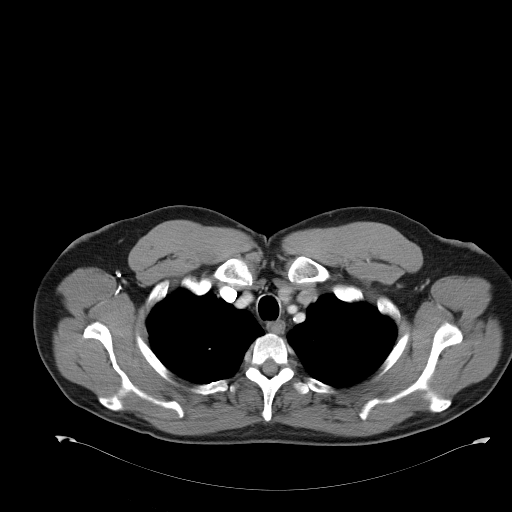
[im 132/141  lung]
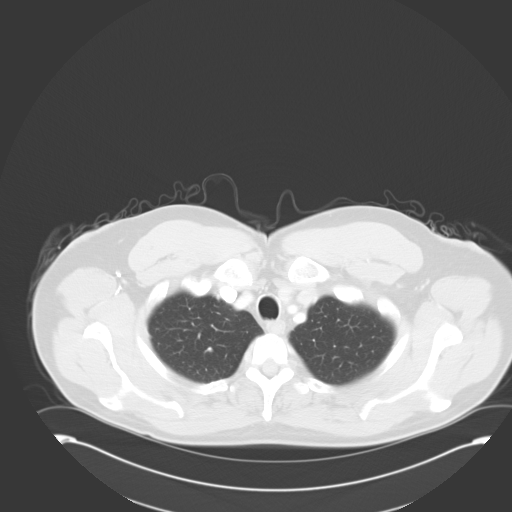

[Series 602: <mpr thick range> · coronal · 1.37mm/px · 3 of 91 slices shown]
[im 31/91  soft-tissue]
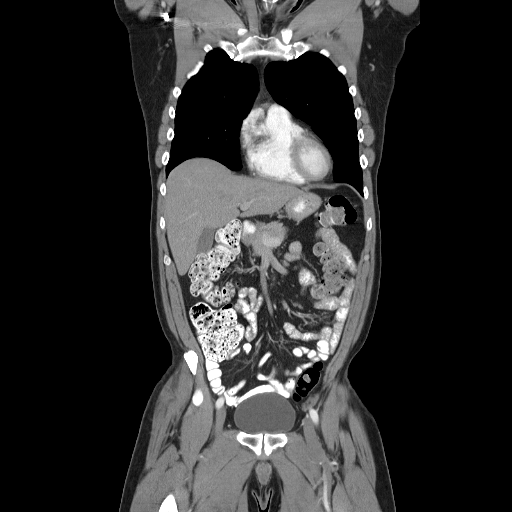
[im 41/91  soft-tissue]
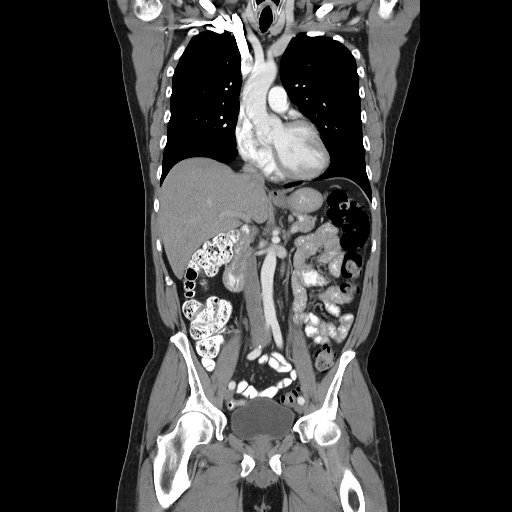
[im 51/91  soft-tissue]
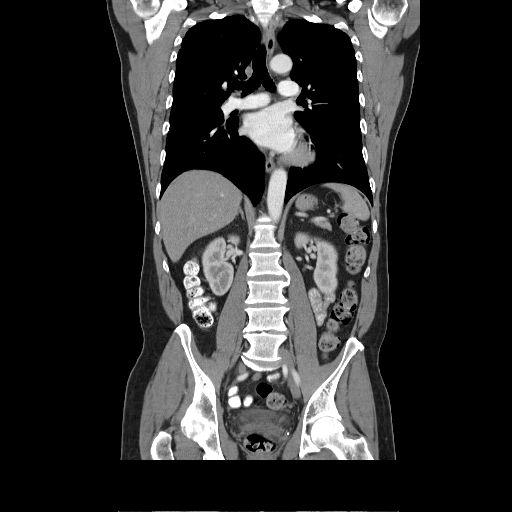

[14 of 46 positions shown; findings below may reference images not displayed]

FINDINGS: CT CHEST FINDINGS

Mediastinum/Lymph Nodes: No masses, pathologically enlarged lymph
nodes, or other significant abnormality.

Lungs/Pleura: No pulmonary mass, infiltrate, or effusion.

Musculoskeletal: No chest wall mass or suspicious bone lesions
identified.

CT ABDOMEN PELVIS FINDINGS

Hepatobiliary: No masses or other significant abnormality.

Pancreas: No mass, inflammatory changes, or other significant
abnormality.

Spleen: Within normal limits in size and appearance.

Adrenals/Urinary Tract: No masses identified. No evidence of
hydronephrosis.

Stomach/Bowel: No evidence of obstruction, inflammatory process, or
abnormal fluid collections.

Vascular/Lymphatic: No pathologically enlarged lymph nodes. Small
right lower quadrant mesenteric nodes measuring up to 5 mm remain
stable and are not pathologically enlarged. No evidence of abdominal
aortic aneursym.

Reproductive: No mass or other significant abnormality.

Other: None.

Musculoskeletal:  No suspicious bone lesions identified.
IMPRESSION: Stable exam. No evidence of recurrent or metastatic carcinoma within
the chest, abdomen, or pelvis.

## 2017-05-15 DIAGNOSIS — S9032XA Contusion of left foot, initial encounter: Secondary | ICD-10-CM | POA: Diagnosis not present

## 2017-06-02 DIAGNOSIS — H52203 Unspecified astigmatism, bilateral: Secondary | ICD-10-CM | POA: Diagnosis not present

## 2017-10-12 ENCOUNTER — Encounter (HOSPITAL_BASED_OUTPATIENT_CLINIC_OR_DEPARTMENT_OTHER): Payer: Self-pay | Admitting: *Deleted

## 2017-10-12 ENCOUNTER — Other Ambulatory Visit: Payer: Self-pay

## 2017-10-12 ENCOUNTER — Ambulatory Visit: Payer: Self-pay | Admitting: Surgery

## 2017-10-12 DIAGNOSIS — K4090 Unilateral inguinal hernia, without obstruction or gangrene, not specified as recurrent: Secondary | ICD-10-CM | POA: Diagnosis not present

## 2017-10-12 NOTE — H&P (Signed)
Devon Bray 10/12/2017 11:26 AM Location: [facility] Patient #: [id number] DOB: Jun 16, 1966 Married / Language: Devon Bray Molt / Race: White Male  History of Present Illness Devon Bray Devon Bray A. Devon Bray Kouns MD; 10/12/2017 1:38 PM) Patient words: [name]  Patient presents for evaluation of bulge in his right inguinal canal. He's had this for a number of months. He has a history of rectal cancer and left inguinal hernia repair in the past. The bulge is getting larger causing mild to moderate discomfort. Location is right groin region. It pops and pops out without difficulty.  The patient is a 51 year old male.   Allergies Devon Bray Moan, RMA; 10/12/2017 11:26 AM) Penicillin V *PENICILLINS*  Medication History Devon Bray Moan, RMA; 10/12/2017 11:26 AM) EPINEPHrine (0.3MG /0.3ML Soln Auto-inj, Injection) Active. Fish Oil (1000MG  Capsule DR, Oral) Active. Multiple Vitamins (Oral) Active. Probiotic (Oral) Active. Medications Reconciled    Vitals Devon Bray Moan RMA; 10/12/2017 11:27 AM) 10/12/2017 11:27 AM Weight: 185 lb Height: 72in Body Surface Area: 2.06 m Body Mass Index: 25.09 kg/m  Temp.: 97.60F  Pulse: 62 (Regular)  BP: 116/80 (Sitting, Left Arm, Standard)      Physical Exam (Devon Bray Devon Bray A. Devon Bray Chaidez MD; 10/12/2017 1:38 PM)  General Mental Status-Alert. General Appearance-Consistent with stated age. Hydration-Well hydrated. Voice-Normal.  Head and Neck Head-normocephalic, atraumatic with no lesions or palpable masses. Trachea-midline. Thyroid Gland Characteristics - normal size and consistency.  Chest and Lung Exam Chest and lung exam reveals -quiet, even and easy respiratory effort with no use of accessory muscles and on auscultation, normal breath sounds, no adventitious sounds and normal vocal resonance. Inspection Chest Wall - Normal. Back - normal.  Cardiovascular Cardiovascular examination reveals -normal heart sounds,  regular rate and rhythm with no murmurs and normal pedal pulses bilaterally.  Abdomen Note: Lower midline scar well-healed. Left inguinal scar noted. Reducible right inguinal hernia moderate size  Neurologic Neurologic evaluation reveals -alert and oriented x 3 with no impairment of recent or remote memory. Mental Status-Normal.  Musculoskeletal Normal Exam - Left-Upper Extremity Strength Normal and Lower Extremity Strength Normal. Normal Exam - Right-Upper Extremity Strength Normal and Lower Extremity Strength Normal.    Assessment & Plan (Devon Bray Devon Bray A. Devon Bray Arons MD; 10/12/2017 1:39 PM)  RIGHT INGUINAL HERNIA (K40.90) Impression: Patient desires repair. Discussed risks, benefits and other possible nonsurgical alternatives. Discussed the use of mesh and pros and cons of mesh use. The risk of hernia repair include bleeding, infection, organ injury, bowel injury, bladder injury, nerve injury recurrent hernia, blood clots, worsening of underlying condition, chronic pain, mesh use, open surgery, death, and the need for other operattions. Pt agrees to proceed  Current Plans You are being scheduled for surgery- Our schedulers will call you.  You should hear from our office's scheduling department within 5 working days about the location, date, and time of surgery. We try to make accommodations for patient's preferences in scheduling surgery, but sometimes the OR schedule or the surgeon's schedule prevents [place] from making those accommodations.  If you have not heard from our office [phone] in 5 working days, call the office and ask for your surgeon's nurse.  If you have other questions about your diagnosis, plan, or surgery, call the office and ask for your surgeon's nurse.  Pt Education - Pamphlet Given - Hernia Surgery: discussed with patient and provided information. The anatomy & physiology of the abdominal wall and pelvic floor was discussed. The pathophysiology of hernias in  the inguinal and pelvic region was discussed. Natural history risks such as progressive enlargement, pain,  incarceration, and strangulation was discussed. Contributors to complications such as smoking, obesity, diabetes, prior surgery, etc were discussed.  I feel the risks of no intervention will lead to serious problems that outweigh the operative risks; therefore, I recommended surgery to reduce and repair the hernia. I explained laparoscopic techniques with possible need for an open approach. I noted usual use of mesh to patch and/or buttress hernia repair  Risks such as bleeding, infection, abscess, need for further treatment, heart attack, death, and other risks were discussed. I noted a good likelihood this will help address the problem. Goals of post-operative recovery were discussed as well. Possibility that this will not correct all symptoms was explained. I stressed the importance of low-impact activity, aggressive pain control, avoiding constipation, & not pushing through pain to minimize risk of post-operative chronic pain or injury. Possibility of reherniation was discussed. We will work to minimize complications.  An educational handout further explaining the pathology & treatment options was given as well. Questions were answered. The patient expresses understanding & wishes to proceed with surgery.

## 2017-10-12 NOTE — H&P (View-Only) (Signed)
MIKYLE Bray 10/12/2017 11:26 AM Location: [facility] Patient #: [id number] DOB: [birth year] Married / Language: Devon Bray / Race: White Male  History of Present Illness Devon Bray A. Devon Zwiefelhofer MD; 10/12/2017 1:38 PM) Patient words: [facility]  Patient presents for evaluation of bulge in his right inguinal canal. He's had this for a number of months. He has a history of rectal cancer and left inguinal hernia repair in the past. The bulge is getting larger causing mild to moderate discomfort. Location is right groin region. It pops and pops out without difficulty.  The patient is a 51 year old male.   Allergies [name], RMA; 10/12/2017 11:26 AM) Penicillin V *PENICILLINS*  Medication History [name], RMA; 10/12/2017 11:26 AM) EPINEPHrine (0.3MG /0.3ML Soln Auto-inj, Injection) Active. Fish Oil (1000MG  Capsule DR, Oral) Active. Multiple Vitamins (Oral) Active. Probiotic (Oral) Active. Medications Reconciled    Vitals [name] RMA; 10/12/2017 11:27 AM) 10/12/2017 11:27 AM Weight: 185 lb Height: 72in Body Surface Area: 2.06 m Body Mass Index: 25.09 kg/m  Temp.: 97.13F  Pulse: 62 (Regular)  BP: 116/80 (Sitting, Left Arm, Standard)      Physical Exam (Devon Dubois A. Marquesha Robideau MD; 10/12/2017 1:38 PM)  General Mental Status-Alert. General Appearance-Consistent with stated age. Hydration-Well hydrated. Voice-Normal.  Head and Neck Head-normocephalic, atraumatic with no lesions or palpable masses. Trachea-midline. Thyroid Gland Characteristics - normal size and consistency.  Chest and Lung Exam Chest and lung exam reveals -quiet, even and easy respiratory effort with no use of accessory muscles and on auscultation, normal breath sounds, no adventitious sounds and normal vocal resonance. Inspection Chest Wall - Normal. Back - normal.  Cardiovascular Cardiovascular examination reveals -normal heart sounds,  regular rate and rhythm with no murmurs and normal pedal pulses bilaterally.  Abdomen Note: Lower midline scar well-healed. Left inguinal scar noted. Reducible right inguinal hernia moderate size  Neurologic Neurologic evaluation reveals -alert and oriented x 3 with no impairment of recent or remote memory. Mental Status-Normal.  Musculoskeletal Normal Exam - Left-Upper Extremity Strength Normal and Lower Extremity Strength Normal. Normal Exam - Right-Upper Extremity Strength Normal and Lower Extremity Strength Normal.    Assessment & Plan (Devon Denman A. Kelly Ranieri MD; 10/12/2017 1:39 PM)  RIGHT INGUINAL HERNIA (K40.90) Impression: Patient desires repair. Discussed risks, benefits and other possible nonsurgical alternatives. Discussed the use of mesh and pros and cons of mesh use. The risk of hernia repair include bleeding, infection, organ injury, bowel injury, bladder injury, nerve injury recurrent hernia, blood clots, worsening of underlying condition, chronic pain, mesh use, open surgery, death, and the need for other operattions. Pt agrees to proceed  Current Plans You are being scheduled for surgery- Our schedulers will call you.  You should hear from our office's scheduling department within 5 working days about the location, date, and time of surgery. We try to make accommodations for patient's preferences in scheduling surgery, but sometimes the OR schedule or the surgeon's schedule prevents [place] from making those accommodations.  If you have not heard from our office [phone]) in 5 working days, call the office and ask for your surgeon's nurse.  If you have other questions about your diagnosis, plan, or surgery, call the office and ask for your surgeon's nurse.  Pt Education - Pamphlet Given - Hernia Surgery: discussed with patient and provided information. The anatomy & physiology of the abdominal wall and pelvic floor was discussed. The pathophysiology of hernias in  the inguinal and pelvic region was discussed. Natural history risks such as progressive enlargement, pain,  incarceration, and strangulation was discussed. Contributors to complications such as smoking, obesity, diabetes, prior surgery, etc were discussed.  I feel the risks of no intervention will lead to serious problems that outweigh the operative risks; therefore, I recommended surgery to reduce and repair the hernia. I explained laparoscopic techniques with possible need for an open approach. I noted usual use of mesh to patch and/or buttress hernia repair  Risks such as bleeding, infection, abscess, need for further treatment, heart attack, death, and other risks were discussed. I noted a good likelihood this will help address the problem. Goals of post-operative recovery were discussed as well. Possibility that this will not correct all symptoms was explained. I stressed the importance of low-impact activity, aggressive pain control, avoiding constipation, & not pushing through pain to minimize risk of post-operative chronic pain or injury. Possibility of reherniation was discussed. We will work to minimize complications.  An educational handout further explaining the pathology & treatment options was given as well. Questions were answered. The patient expresses understanding & wishes to proceed with surgery.

## 2017-10-15 DIAGNOSIS — Z23 Encounter for immunization: Secondary | ICD-10-CM | POA: Diagnosis not present

## 2017-10-16 NOTE — Progress Notes (Signed)
Pt in to pick up Pre Op ERAS drink. Instructions reviewed.

## 2017-10-29 ENCOUNTER — Ambulatory Visit (HOSPITAL_BASED_OUTPATIENT_CLINIC_OR_DEPARTMENT_OTHER): Payer: 59 | Admitting: Anesthesiology

## 2017-10-29 ENCOUNTER — Encounter (HOSPITAL_BASED_OUTPATIENT_CLINIC_OR_DEPARTMENT_OTHER): Payer: Self-pay

## 2017-10-29 ENCOUNTER — Other Ambulatory Visit: Payer: Self-pay

## 2017-10-29 ENCOUNTER — Ambulatory Visit (HOSPITAL_BASED_OUTPATIENT_CLINIC_OR_DEPARTMENT_OTHER)
Admission: RE | Admit: 2017-10-29 | Discharge: 2017-10-29 | Disposition: A | Discharge status: Home / Self Care | Payer: 59 | Source: Ambulatory Visit | Attending: Surgery | Admitting: Surgery

## 2017-10-29 ENCOUNTER — Encounter (HOSPITAL_BASED_OUTPATIENT_CLINIC_OR_DEPARTMENT_OTHER)
Admission: RE | Disposition: A | Discharge status: Home / Self Care | Payer: Self-pay | Source: Ambulatory Visit | Attending: Surgery

## 2017-10-29 DIAGNOSIS — Z91018 Allergy to other foods: Secondary | ICD-10-CM | POA: Diagnosis not present

## 2017-10-29 DIAGNOSIS — Z91030 Bee allergy status: Secondary | ICD-10-CM | POA: Diagnosis not present

## 2017-10-29 DIAGNOSIS — K4090 Unilateral inguinal hernia, without obstruction or gangrene, not specified as recurrent: Secondary | ICD-10-CM | POA: Diagnosis not present

## 2017-10-29 DIAGNOSIS — Z85048 Personal history of other malignant neoplasm of rectum, rectosigmoid junction, and anus: Secondary | ICD-10-CM | POA: Insufficient documentation

## 2017-10-29 DIAGNOSIS — G8918 Other acute postprocedural pain: Secondary | ICD-10-CM | POA: Diagnosis not present

## 2017-10-29 DIAGNOSIS — K219 Gastro-esophageal reflux disease without esophagitis: Secondary | ICD-10-CM | POA: Diagnosis not present

## 2017-10-29 DIAGNOSIS — Z880 Allergy status to penicillin: Secondary | ICD-10-CM | POA: Insufficient documentation

## 2017-10-29 DIAGNOSIS — Z800 Family history of malignant neoplasm of digestive organs: Secondary | ICD-10-CM | POA: Diagnosis not present

## 2017-10-29 DIAGNOSIS — Z79899 Other long term (current) drug therapy: Secondary | ICD-10-CM | POA: Diagnosis not present

## 2017-10-29 HISTORY — PX: INSERTION OF MESH: SHX5868

## 2017-10-29 HISTORY — DX: Unilateral inguinal hernia, without obstruction or gangrene, not specified as recurrent: K40.90

## 2017-10-29 HISTORY — PX: INGUINAL HERNIA REPAIR: SHX194

## 2017-10-29 HISTORY — DX: Gastro-esophageal reflux disease without esophagitis: K21.9

## 2017-10-29 SURGERY — REPAIR, HERNIA, INGUINAL, ADULT
Anesthesia: General | Site: Groin | Laterality: Right

## 2017-10-29 MED ORDER — DEXAMETHASONE SODIUM PHOSPHATE 10 MG/ML IJ SOLN
INTRAMUSCULAR | Status: AC
  Filled 2017-10-29: qty 1

## 2017-10-29 MED ORDER — ROCURONIUM BROMIDE 100 MG/10ML IV SOLN
INTRAVENOUS | Status: DC | PRN
  Administered 2017-10-29: 50 mg via INTRAVENOUS

## 2017-10-29 MED ORDER — BUPIVACAINE-EPINEPHRINE (PF) 0.5% -1:200000 IJ SOLN
INTRAMUSCULAR | Status: DC | PRN
Start: 2017-10-29 — End: 2017-10-29
  Administered 2017-10-29: 30 mL via PERINEURAL

## 2017-10-29 MED ORDER — PROPOFOL 10 MG/ML IV BOLUS
INTRAVENOUS | Status: DC | PRN
  Administered 2017-10-29: 150 mg via INTRAVENOUS

## 2017-10-29 MED ORDER — FENTANYL CITRATE (PF) 100 MCG/2ML IJ SOLN
50.0000 ug | INTRAMUSCULAR | Status: DC | PRN
  Administered 2017-10-29 (×2): 50 ug via INTRAVENOUS

## 2017-10-29 MED ORDER — OXYCODONE HCL 5 MG PO TABS
5.0000 mg | ORAL_TABLET | Freq: Once | ORAL | Status: AC | PRN
  Administered 2017-10-29: 5 mg via ORAL

## 2017-10-29 MED ORDER — DEXAMETHASONE SODIUM PHOSPHATE 4 MG/ML IJ SOLN
INTRAMUSCULAR | Status: DC | PRN
  Administered 2017-10-29 (×2): 10 mg via INTRAVENOUS

## 2017-10-29 MED ORDER — SCOPOLAMINE 1 MG/3DAYS TD PT72: 1.0000 | MEDICATED_PATCH | Freq: Once | TRANSDERMAL | Status: DC | PRN

## 2017-10-29 MED ORDER — FENTANYL CITRATE (PF) 100 MCG/2ML IJ SOLN
INTRAMUSCULAR | Status: AC
  Filled 2017-10-29: qty 2

## 2017-10-29 MED ORDER — FENTANYL CITRATE (PF) 100 MCG/2ML IJ SOLN
25.0000 ug | INTRAMUSCULAR | Status: DC | PRN
  Administered 2017-10-29: 25 ug via INTRAVENOUS

## 2017-10-29 MED ORDER — BUPIVACAINE-EPINEPHRINE 0.25% -1:200000 IJ SOLN
INTRAMUSCULAR | Status: DC | PRN
  Administered 2017-10-29: 7 mL

## 2017-10-29 MED ORDER — PROMETHAZINE HCL 25 MG/ML IJ SOLN: 6.2500 mg | INTRAMUSCULAR | Status: DC | PRN

## 2017-10-29 MED ORDER — FENTANYL CITRATE (PF) 100 MCG/2ML IJ SOLN
INTRAMUSCULAR | Status: AC
Start: 2017-10-29 — End: ?
  Filled 2017-10-29: qty 2

## 2017-10-29 MED ORDER — CIPROFLOXACIN IN D5W 400 MG/200ML IV SOLN
INTRAVENOUS | Status: AC
  Filled 2017-10-29: qty 200

## 2017-10-29 MED ORDER — OXYCODONE HCL 5 MG PO TABS
ORAL_TABLET | ORAL | Status: AC
  Filled 2017-10-29: qty 1

## 2017-10-29 MED ORDER — ONDANSETRON HCL 4 MG/2ML IJ SOLN
INTRAMUSCULAR | Status: DC | PRN
  Administered 2017-10-29: 4 mg via INTRAVENOUS

## 2017-10-29 MED ORDER — ACETAMINOPHEN 500 MG PO TABS
1000.0000 mg | ORAL_TABLET | ORAL | Status: AC
  Administered 2017-10-29: 1000 mg via ORAL

## 2017-10-29 MED ORDER — SUGAMMADEX SODIUM 200 MG/2ML IV SOLN
INTRAVENOUS | Status: AC
  Filled 2017-10-29: qty 2

## 2017-10-29 MED ORDER — ONDANSETRON HCL 4 MG/2ML IJ SOLN
INTRAMUSCULAR | Status: AC
  Filled 2017-10-29: qty 2

## 2017-10-29 MED ORDER — CHLORHEXIDINE GLUCONATE CLOTH 2 % EX PADS: 6.0000 | MEDICATED_PAD | Freq: Once | CUTANEOUS | Status: DC

## 2017-10-29 MED ORDER — GABAPENTIN 300 MG PO CAPS
300.0000 mg | ORAL_CAPSULE | ORAL | Status: AC
  Administered 2017-10-29: 300 mg via ORAL

## 2017-10-29 MED ORDER — ACETAMINOPHEN 500 MG PO TABS
ORAL_TABLET | ORAL | Status: AC
  Filled 2017-10-29: qty 2

## 2017-10-29 MED ORDER — SUGAMMADEX SODIUM 200 MG/2ML IV SOLN
INTRAVENOUS | Status: DC | PRN
  Administered 2017-10-29: 200 mg via INTRAVENOUS

## 2017-10-29 MED ORDER — MIDAZOLAM HCL 2 MG/2ML IJ SOLN
INTRAMUSCULAR | Status: AC
  Filled 2017-10-29: qty 2

## 2017-10-29 MED ORDER — CELECOXIB 200 MG PO CAPS
200.0000 mg | ORAL_CAPSULE | ORAL | Status: AC
  Administered 2017-10-29: 200 mg via ORAL

## 2017-10-29 MED ORDER — OXYCODONE HCL 5 MG PO TABS: 5.0000 mg | ORAL_TABLET | Freq: Four times a day (QID) | ORAL | 0 refills | Status: DC | PRN

## 2017-10-29 MED ORDER — IBUPROFEN 800 MG PO TABS: 800.0000 mg | ORAL_TABLET | Freq: Three times a day (TID) | ORAL | 0 refills | Status: DC | PRN

## 2017-10-29 MED ORDER — ROCURONIUM BROMIDE 10 MG/ML (PF) SYRINGE
PREFILLED_SYRINGE | INTRAVENOUS | Status: AC
  Filled 2017-10-29: qty 5

## 2017-10-29 MED ORDER — LIDOCAINE 2% (20 MG/ML) 5 ML SYRINGE
INTRAMUSCULAR | Status: AC
  Filled 2017-10-29: qty 5

## 2017-10-29 MED ORDER — MIDAZOLAM HCL 2 MG/2ML IJ SOLN
1.0000 mg | INTRAMUSCULAR | Status: DC | PRN
  Administered 2017-10-29: 2 mg via INTRAVENOUS

## 2017-10-29 MED ORDER — CIPROFLOXACIN IN D5W 400 MG/200ML IV SOLN: 400.0000 mg | INTRAVENOUS | Status: DC

## 2017-10-29 MED ORDER — GABAPENTIN 300 MG PO CAPS
ORAL_CAPSULE | ORAL | Status: AC
  Filled 2017-10-29: qty 1

## 2017-10-29 MED ORDER — LACTATED RINGERS IV SOLN
INTRAVENOUS | Status: DC
  Administered 2017-10-29: 09:00:00 via INTRAVENOUS

## 2017-10-29 MED ORDER — CELECOXIB 200 MG PO CAPS
ORAL_CAPSULE | ORAL | Status: AC
  Filled 2017-10-29: qty 1

## 2017-10-29 MED ORDER — LIDOCAINE HCL (CARDIAC) 20 MG/ML IV SOLN
INTRAVENOUS | Status: DC | PRN
  Administered 2017-10-29: 60 mg via INTRAVENOUS

## 2017-10-29 SURGICAL SUPPLY — 50 items
BLADE CLIPPER SURG (BLADE) ×3 IMPLANT
BLADE SURG 15 STRL LF DISP TIS (BLADE) ×1 IMPLANT
BLADE SURG 15 STRL SS (BLADE) ×2
CANISTER SUCT 1200ML W/VALVE (MISCELLANEOUS) IMPLANT
CHLORAPREP W/TINT 26ML (MISCELLANEOUS) ×3 IMPLANT
COVER BACK TABLE 60X90IN (DRAPES) ×3 IMPLANT
COVER MAYO STAND STRL (DRAPES) ×3 IMPLANT
DECANTER SPIKE VIAL GLASS SM (MISCELLANEOUS) IMPLANT
DERMABOND ADVANCED (GAUZE/BANDAGES/DRESSINGS) ×2
DERMABOND ADVANCED .7 DNX12 (GAUZE/BANDAGES/DRESSINGS) ×1 IMPLANT
DRAIN PENROSE 1/2X12 LTX STRL (WOUND CARE) ×3 IMPLANT
DRAPE LAPAROTOMY TRNSV 102X78 (DRAPE) ×3 IMPLANT
DRAPE UTILITY XL STRL (DRAPES) ×3 IMPLANT
ELECT COATED BLADE 2.86 ST (ELECTRODE) ×3 IMPLANT
ELECT REM PT RETURN 9FT ADLT (ELECTROSURGICAL) ×3
ELECTRODE REM PT RTRN 9FT ADLT (ELECTROSURGICAL) ×1 IMPLANT
GAUZE SPONGE 4X4 12PLY STRL LF (GAUZE/BANDAGES/DRESSINGS) IMPLANT
GAUZE SPONGE 4X4 16PLY XRAY LF (GAUZE/BANDAGES/DRESSINGS) IMPLANT
GLOVE BIO SURGEON STRL SZ 6.5 (GLOVE) ×2 IMPLANT
GLOVE BIO SURGEONS STRL SZ 6.5 (GLOVE) ×1
GLOVE BIOGEL PI IND STRL 7.0 (GLOVE) ×1 IMPLANT
GLOVE BIOGEL PI IND STRL 8 (GLOVE) ×1 IMPLANT
GLOVE BIOGEL PI INDICATOR 7.0 (GLOVE) ×2
GLOVE BIOGEL PI INDICATOR 8 (GLOVE) ×2
GLOVE ECLIPSE 8.0 STRL XLNG CF (GLOVE) ×3 IMPLANT
GOWN STRL REUS W/ TWL LRG LVL3 (GOWN DISPOSABLE) ×2 IMPLANT
GOWN STRL REUS W/TWL LRG LVL3 (GOWN DISPOSABLE) ×4
MESH HERNIA SYS ULTRAPRO LRG (Mesh General) ×3 IMPLANT
NEEDLE HYPO 25X1 1.5 SAFETY (NEEDLE) ×3 IMPLANT
NS IRRIG 1000ML POUR BTL (IV SOLUTION) IMPLANT
PACK BASIN DAY SURGERY FS (CUSTOM PROCEDURE TRAY) ×3 IMPLANT
PENCIL BUTTON HOLSTER BLD 10FT (ELECTRODE) ×3 IMPLANT
SLEEVE SCD COMPRESS KNEE MED (MISCELLANEOUS) ×3 IMPLANT
SPONGE LAP 4X18 X RAY DECT (DISPOSABLE) ×3 IMPLANT
SUT MON AB 4-0 PC3 18 (SUTURE) ×3 IMPLANT
SUT NOVA 0 T19/GS 22DT (SUTURE) ×6 IMPLANT
SUT VIC AB 0 SH 27 (SUTURE) ×3 IMPLANT
SUT VIC AB 2-0 SH 27 (SUTURE) ×2
SUT VIC AB 2-0 SH 27XBRD (SUTURE) ×1 IMPLANT
SUT VIC AB 3-0 54X BRD REEL (SUTURE) IMPLANT
SUT VIC AB 3-0 BRD 54 (SUTURE)
SUT VICRYL 3-0 CR8 SH (SUTURE) ×3 IMPLANT
SUT VICRYL AB 2 0 TIE (SUTURE) IMPLANT
SUT VICRYL AB 2 0 TIES (SUTURE)
SYR CONTROL 10ML LL (SYRINGE) ×3 IMPLANT
TOWEL OR 17X24 6PK STRL BLUE (TOWEL DISPOSABLE) ×3 IMPLANT
TOWEL OR NON WOVEN STRL DISP B (DISPOSABLE) ×3 IMPLANT
TUBE CONNECTING 20'X1/4 (TUBING)
TUBE CONNECTING 20X1/4 (TUBING) IMPLANT
YANKAUER SUCT BULB TIP NO VENT (SUCTIONS) IMPLANT

## 2017-10-29 NOTE — Op Note (Signed)
Right Inguinal Hernia, Open, Procedure Note with mesh   Indications: The patient presented with a history of a right, reducible  Inguinal hernia.  The risk of hernia repair include bleeding,  Infection,   Recurrence of the hernia,  Mesh use, chronic pain,  Organ injury,  Bowel injury,  Bladder injury,   nerve injury with numbness around the incision,  Death,  and worsening of preexisting  medical problems.  The alternatives to surgery have been discussed as well..  Long term expectations of both operative and non operative treatments have been discussed.   The patient agrees to proceed.  Pre-operative Diagnosis: right inguinal hernia reducible  Post-operative Diagnosis: same  Surgeon: Joyice Faster Bettie Capistran   Assistants: none  Anesthesia: General endotracheal anesthesia and TEP block with local  ASA Class: 2  Procedure Details  The patient was seen again in the Holding Room. The risks, benefits, complications, treatment options, and expected outcomes were discussed with the patient. The possibilities of reaction to medication, pulmonary aspiration, perforation of viscus, bleeding, recurrent infection, the need for additional procedures, and development of a complication requiring transfusion or further operation were discussed with the patient and/or family. There was concurrence with the proposed plan, and informed consent was obtained. The site of surgery was properly noted/marked. The patient was taken to the Operating Room, identified as Devon Bray, and the procedure verified as hernia repair. A Time Out was held and the above information confirmed.  The patient was placed in the supine position and underwent induction of anesthesia, the lower abdomen and groin was prepped and draped in the standard fashion, and 02.5% Marcaine with epinephrine was used to anesthetize the skin over the mid-portion of the inguinal canal. A transverse incision was made. Dissection was carried through the soft  tissue to expose the inguinal canal and inguinal ligament along its lower edge. The external oblique fascia was split along the course of its fibers, exposing the inguinal canal. The cord and nerve were looped using a Penrose drain and reflected out of the field. The defect was exposed and a piece of prolene hernia system ultrapro mesh was and placed into the indirect  defect. Interupted 1-0 novafil suture was then used  to repair the defect, with the suture being sewn from the pubic tubercle inferiorly and superiorly along the canal to a level just beyond the internal ring. The mesh was split to allow passage of the cord and  into the canal without entrapment.  The ilioinguinal nerve was trapped under the mesh and was ligated to prevent entrapment and post op neuralgia.  The contents were then returned to canal and the external oblique fashion was then closed in a continuous fashion using 2-0 Vicryl suture taking care not to cause entrapment. Scarpa's layer closed with 3 0 vicryl and 4 0 monocryl used to close the skin.  Dermabond used for dressing.  Instrument, sponge, and needle counts were correct prior to closure and at the conclusion of the case.  Findings: Hernia as above  Estimated Blood Loss: less than 50 mL         Drains: None         Total IV Fluids: per record         Specimens: none                Complications: None; patient tolerated the procedure well.         Disposition: PACU - hemodynamically stable.  Condition: stable

## 2017-10-29 NOTE — Transfer of Care (Signed)
Immediate Anesthesia Transfer of Care Note  Patient: Devon Bray  Procedure(s) Performed: REPAIR RIGHT HERNIA  INGUINAL WITH MESH ERAS PATHWAY (Right Groin) INSERTION OF MESH (Right Groin)  Patient Location: PACU  Anesthesia Type:General  Level of Consciousness: awake and sedated  Airway & Oxygen Therapy: Patient Spontanous Breathing and Patient connected to face mask oxygen  Post-op Assessment: Report given to RN and Post -op Vital signs reviewed and stable  Post vital signs: Reviewed and stable  Last Vitals:  Vitals:   10/29/17 0855 10/29/17 0900  BP:  124/75  Pulse: 80 87  Resp: 14 13  Temp:    SpO2: 100% 100%    Last Pain:  Vitals:   10/29/17 0818  TempSrc: Oral         Complications: No apparent anesthesia complications

## 2017-10-29 NOTE — Anesthesia Preprocedure Evaluation (Addendum)
Anesthesia Evaluation  Patient identified by MRN, date of birth, ID band Patient awake    Reviewed: Allergy & Precautions, NPO status , Patient's Chart, lab work & pertinent test results  Airway Mallampati: II  TM Distance: >3 FB Neck ROM: Full    Dental  (+) Teeth Intact, Dental Advisory Given   Pulmonary neg pulmonary ROS,    Pulmonary exam normal breath sounds clear to auscultation       Cardiovascular negative cardio ROS Normal cardiovascular exam Rhythm:Regular Rate:Normal     Neuro/Psych negative neurological ROS  negative psych ROS   GI/Hepatic Neg liver ROS, GERD  ,Rectal cancer RIH   Endo/Other  negative endocrine ROS  Renal/GU negative Renal ROS     Musculoskeletal negative musculoskeletal ROS (+)   Abdominal   Peds  Hematology negative hematology ROS (+)   Anesthesia Other Findings Day of surgery medications reviewed with the patient.  Reproductive/Obstetrics                             Anesthesia Physical Anesthesia Plan  ASA: II  Anesthesia Plan: General   Post-op Pain Management:  Regional for Post-op pain   Induction: Intravenous  PONV Risk Score and Plan: 2 and Dexamethasone and Ondansetron  Airway Management Planned: Oral ETT  Additional Equipment:   Intra-op Plan:   Post-operative Plan: Extubation in OR  Informed Consent: I have reviewed the patients History and Physical, chart, labs and discussed the procedure including the risks, benefits and alternatives for the proposed anesthesia with the patient or authorized representative who has indicated his/her understanding and acceptance.   Dental advisory given  Plan Discussed with: CRNA  Anesthesia Plan Comments: (Risks/benefits of general anesthesia discussed with patient including risk of damage to teeth, lips, gum, and tongue, nausea/vomiting, allergic reactions to medications, and the possibility of  heart attack, stroke and death.  All patient questions answered.  Patient wishes to proceed.)        Anesthesia Quick Evaluation

## 2017-10-29 NOTE — Interval H&P Note (Signed)
History and Physical Interval Note:  10/29/2017 9:33 AM  Devon Bray  has presented today for surgery, with the diagnosis of RIGHT INGUINAL HERNIA  The various methods of treatment have been discussed with the patient and family. After consideration of risks, benefits and other options for treatment, the patient has consented to  Procedure(s): [place] (Right) INSERTION OF MESH (Right) as a surgical intervention .  The patient's history has been reviewed, patient examined, no change in status, stable for surgery.  I have reviewed the patient's chart and labs.  Questions were answered to the patient's satisfaction.     [place]

## 2017-10-29 NOTE — Anesthesia Procedure Notes (Signed)
Procedure Name: Intubation Performed by: Thana Ramp W, CRNA Pre-anesthesia Checklist: Patient identified, Emergency Drugs available, Suction available and Patient being monitored Patient Re-evaluated:Patient Re-evaluated prior to induction Oxygen Delivery Method: Circle system utilized Preoxygenation: Pre-oxygenation with 100% oxygen Induction Type: IV induction Ventilation: Mask ventilation without difficulty Laryngoscope Size: Miller and 2 Tube type: Oral Tube size: 7.0 mm Number of attempts: 1 Airway Equipment and Method: Stylet and Oral airway Placement Confirmation: ETT inserted through vocal cords under direct vision,  positive ETCO2 and breath sounds checked- equal and bilateral Secured at: 22 cm Tube secured with: Tape Dental Injury: Teeth and Oropharynx as per pre-operative assessment        

## 2017-10-29 NOTE — Progress Notes (Signed)
AssistedDr. Turk with right, ultrasound guided, transabdominal plane block. Side rails up, monitors on throughout procedure. See vital signs in flow sheet. Tolerated Procedure well.  

## 2017-10-29 NOTE — Anesthesia Procedure Notes (Signed)
Anesthesia Regional Block: TAP block   Pre-Anesthetic Checklist: ,, timeout performed, Correct Patient, Correct Site, Correct Laterality, Correct Procedure, Correct Position, site marked, Risks and benefits discussed,  Surgical consent,  Pre-op evaluation,  At surgeon's request and post-op pain management  Laterality: Right  Prep: chloraprep       Needles:  Injection technique: Single-shot  Needle Type: Echogenic Needle     Needle Length: 9cm  Needle Gauge: 21     Additional Needles:   Procedures:,,,, ultrasound used (permanent image in chart),,,,  Narrative:  Start time: 10/29/2017 8:45 AM End time: 10/29/2017 8:50 AM Injection made incrementally with aspirations every 5 mL.  Performed by: Personally  Anesthesiologist: Catalina Gravel, MD  Additional Notes: No pain on injection. No increased resistance to injection. Injection made in 5cc increments.  Good needle visualization.  Patient tolerated procedure well.

## 2017-10-29 NOTE — Anesthesia Postprocedure Evaluation (Signed)
Anesthesia Post Note  Patient: Devon Bray  Procedure(s) Performed: REPAIR RIGHT HERNIA  INGUINAL WITH MESH ERAS PATHWAY (Right Groin) INSERTION OF MESH (Right Groin)     Patient location during evaluation: PACU Anesthesia Type: General Level of consciousness: awake and alert, oriented and awake Pain management: pain level controlled Vital Signs Assessment: post-procedure vital signs reviewed and stable Respiratory status: spontaneous breathing, nonlabored ventilation, respiratory function stable and patient connected to nasal cannula oxygen Cardiovascular status: blood pressure returned to baseline and stable Postop Assessment: no apparent nausea or vomiting Anesthetic complications: no    Last Vitals:  Vitals:   10/29/17 1200 10/29/17 1240  BP: 121/81 123/82  Pulse: 72 72  Resp: 13 16  Temp:  36.9 C  SpO2: 100% 100%    Last Pain:  Vitals:   10/29/17 1240  TempSrc:   PainSc: 3                  Catalina Gravel

## 2017-10-29 NOTE — Discharge Instructions (Signed)
[facility] _______[facility] ° °UMBILICAL OR INGUINAL HERNIA REPAIR: POST OP INSTRUCTIONS ° °Always review your discharge instruction sheet given to you by the facility where your surgery was performed. °IF YOU HAVE DISABILITY OR FAMILY LEAVE FORMS, YOU MUST BRING THEM TO THE OFFICE FOR PROCESSING.   °DO NOT GIVE THEM TO YOUR DOCTOR. ° °1. A  prescription for pain medication may be given to you upon discharge.  Take your pain medication as prescribed, if needed.  If narcotic pain medicine is not needed, then you may take acetaminophen (Tylenol) or ibuprofen (Advil) as needed. °2. Take your usually prescribed medications unless otherwise directed. °If you need a refill on your pain medication, please contact your pharmacy.  They will contact our office to request authorization. Prescriptions will not be filled after 5 pm or on week-ends. °3. You should follow a light diet the first 24 hours after arrival home, such as soup and crackers, etc.  Be sure to include lots of fluids daily.  Resume your normal diet the day after surgery. °4.Most patients will experience some swelling and bruising around the umbilicus or in the groin and scrotum.  Ice packs and reclining will help.  Swelling and bruising can take several days to resolve.  °6. It is common to experience some constipation if taking pain medication after surgery.  Increasing fluid intake and taking a stool softener (such as Colace) will usually help or prevent this problem from occurring.  A mild laxative (Milk of Magnesia or Miralax) should be taken according to package directions if there are no bowel movements after 48 hours. °7. Unless discharge instructions indicate otherwise, you may remove your bandages 24-48 hours after surgery, and you may shower at that time.  You may have steri-strips (small skin tapes) in place directly over the incision.  These strips should be left on the skin for 7-10 days.  If your surgeon used skin glue on the  incision, you may shower in 24 hours.  The glue will flake off over the next 2-3 weeks.  Any sutures or staples will be removed at the office during your follow-up visit. °8. ACTIVITIES:  You may resume regular (light) daily activities beginning the next day--such as daily self-care, walking, climbing stairs--gradually increasing activities as tolerated.  You may have sexual intercourse when it is comfortable.  Refrain from any heavy lifting or straining until approved by your doctor. ° °a.You may drive when you are no longer taking prescription pain medication, you can comfortably wear a seatbelt, and you can safely maneuver your car and apply brakes. °b.RETURN TO WORK:   °_____________________________________________ ° °9.You should see your doctor in the office for a follow-up appointment approximately 2-3 weeks after your surgery.  Make sure that you call for this appointment within a day or two after you arrive home to insure a convenient appointment time. °10.OTHER INSTRUCTIONS: _________________________ °   _____________________________________ ° °WHEN TO CALL YOUR DOCTOR: °1. Fever over 101.0 °2. Inability to urinate °3. Nausea and/or vomiting °4. Extreme swelling or bruising °5. Continued bleeding from incision. °6. Increased pain, redness, or drainage from the incision ° °The clinic staff is available to answer your questions during regular business hours.  Please don’t hesitate to call and ask to speak to one of the nurses for clinical concerns.  If you have a medical emergency, go to the nearest emergency room or call 911.  A surgeon from [facility] is always on call at the hospital ° ° °  [address], [address], [place], [place]  [address] ? ° [address] [address], [place],    [address] °[phone] ? [phone] ? FAX [phone] °Web site: [email or web address] ° ° °Post Anesthesia Home Care Instructions ° °Activity: °Get plenty of rest for the remainder of the day. A  responsible individual must stay with you for 24 hours following the procedure.  °For the next 24 hours, DO NOT: °-Drive a car °-Operate machinery °-Drink alcoholic beverages °-Take any medication unless instructed by your physician °-Make any legal decisions or sign important papers. ° °Meals: °Start with liquid foods such as gelatin or soup. Progress to regular foods as tolerated. Avoid greasy, spicy, heavy foods. If nausea and/or vomiting occur, drink only clear liquids until the nausea and/or vomiting subsides. Call your physician if vomiting continues. ° °Special Instructions/Symptoms: °Your throat may feel dry or sore from the anesthesia or the breathing tube placed in your throat during surgery. If this causes discomfort, gargle with warm salt water. The discomfort should disappear within 24 hours. ° °If you had a scopolamine patch placed behind your ear for the management of post- operative nausea and/or vomiting: ° °1. The medication in the patch is effective for 72 hours, after which it should be removed.  Wrap patch in a tissue and discard in the trash. Wash hands thoroughly with soap and water. °2. You may remove the patch earlier than 72 hours if you experience unpleasant side effects which may include dry mouth, dizziness or visual disturbances. °3. Avoid touching the patch. Wash your hands with soap and water after contact with the patch. °  ° °

## 2017-10-30 ENCOUNTER — Encounter (HOSPITAL_BASED_OUTPATIENT_CLINIC_OR_DEPARTMENT_OTHER): Payer: Self-pay | Admitting: Surgery

## 2017-10-30 NOTE — Addendum Note (Signed)
Addendum  created 10/30/17 2824 by Tawni Millers, CRNA   Charge Capture section accepted

## 2018-01-07 DIAGNOSIS — E7800 Pure hypercholesterolemia, unspecified: Secondary | ICD-10-CM | POA: Diagnosis not present

## 2018-01-07 DIAGNOSIS — Z0000 Encounter for general adult medical examination without abnormal findings: Secondary | ICD-10-CM | POA: Diagnosis not present

## 2018-01-07 DIAGNOSIS — Z85048 Personal history of other malignant neoplasm of rectum, rectosigmoid junction, and anus: Secondary | ICD-10-CM | POA: Diagnosis not present

## 2018-01-07 DIAGNOSIS — Z91018 Allergy to other foods: Secondary | ICD-10-CM | POA: Diagnosis not present

## 2018-01-07 DIAGNOSIS — Z125 Encounter for screening for malignant neoplasm of prostate: Secondary | ICD-10-CM | POA: Diagnosis not present

## 2018-05-18 DIAGNOSIS — L578 Other skin changes due to chronic exposure to nonionizing radiation: Secondary | ICD-10-CM | POA: Diagnosis not present

## 2018-05-18 DIAGNOSIS — D225 Melanocytic nevi of trunk: Secondary | ICD-10-CM | POA: Diagnosis not present

## 2018-05-18 DIAGNOSIS — L570 Actinic keratosis: Secondary | ICD-10-CM | POA: Diagnosis not present

## 2018-05-18 DIAGNOSIS — B079 Viral wart, unspecified: Secondary | ICD-10-CM | POA: Diagnosis not present

## 2018-09-23 DIAGNOSIS — Z23 Encounter for immunization: Secondary | ICD-10-CM | POA: Diagnosis not present

## 2018-11-29 ENCOUNTER — Ambulatory Visit: Payer: Self-pay | Admitting: Physician Assistant

## 2018-11-29 VITALS — BP 124/80 | HR 94 | Temp 99.5°F | Resp 20 | Wt 195.8 lb

## 2018-11-29 DIAGNOSIS — R05 Cough: Secondary | ICD-10-CM

## 2018-11-29 DIAGNOSIS — R059 Cough, unspecified: Secondary | ICD-10-CM

## 2018-11-29 DIAGNOSIS — J22 Unspecified acute lower respiratory infection: Secondary | ICD-10-CM

## 2018-11-29 LAB — POCT INFLUENZA A/B
INFLUENZA A, POC: NEGATIVE
INFLUENZA B, POC: NEGATIVE

## 2018-11-29 MED ORDER — IPRATROPIUM BROMIDE 0.03 % NA SOLN: 2.0000 | Freq: Two times a day (BID) | NASAL | 0 refills | Status: DC

## 2018-11-29 MED ORDER — PROMETHAZINE-DM 6.25-15 MG/5ML PO SYRP: 5.0000 mL | ORAL_SOLUTION | Freq: Four times a day (QID) | ORAL | 0 refills | Status: DC | PRN

## 2018-11-29 MED ORDER — BENZONATATE 100 MG PO CAPS: 100.0000 mg | ORAL_CAPSULE | Freq: Three times a day (TID) | ORAL | 0 refills | Status: DC | PRN

## 2018-11-29 MED ORDER — AZITHROMYCIN 250 MG PO TABS: ORAL_TABLET | ORAL | 0 refills | Status: DC

## 2018-11-29 NOTE — Patient Instructions (Addendum)
Your flu test is negative, which is reassuring.  We will treat for bacterial etiology with azithromycin.  Also recommend symptomatic treatment with Tessalon Perles, cough syrup, and over-the-counter Mucinex.  Can use Atrovent nasal spray for runny nose.  Please follow-up with your family doctor if no improvement with treatment plan.  Seek care sooner at local urgent care if symptoms worsens/develop new concerning symptoms.  Acute Bronchitis, Adult Acute bronchitis is when air tubes (bronchi) in the lungs suddenly get swollen. The condition can make it hard to breathe. It can also cause these symptoms:  A cough.  Coughing up clear, yellow, or green mucus.  Wheezing.  Chest congestion.  Shortness of breath.  A fever.  Body aches.  Chills.  A sore throat. Follow these instructions at home:  Medicines  Take over-the-counter and prescription medicines only as told by your doctor.  If you were prescribed an antibiotic medicine, take it as told by your doctor. Do not stop taking the antibiotic even if you start to feel better. General instructions  Rest.  Drink enough fluids to keep your pee (urine) pale yellow.  Avoid smoking and secondhand smoke. If you smoke and you need help quitting, ask your doctor. Quitting will help your lungs heal faster.  Use an inhaler, cool mist vaporizer, or humidifier as told by your doctor.  Keep all follow-up visits as told by your doctor. This is important. How is this prevented? To lower your risk of getting this condition again:  Wash your hands often with soap and water. If you cannot use soap and water, use hand sanitizer.  Avoid contact with people who have cold symptoms.  Try not to touch your hands to your mouth, nose, or eyes.  Make sure to get the flu shot every year. Contact a doctor if:  Your symptoms do not get better in 2 weeks. Get help right away if:  You cough up blood.  You have chest pain.  You have very bad  shortness of breath.  You become dehydrated.  You faint (pass out) or keep feeling like you are going to pass out.  You keep throwing up (vomiting).  You have a very bad headache.  Your fever or chills gets worse. This information is not intended to replace advice given to you by your health care provider. Make sure you discuss any questions you have with your health care provider. Document Released: 05/05/2008 Document Revised: 07/01/2017 Document Reviewed: 05/07/2016 Elsevier Interactive Patient Education  2019 [employer].

## 2018-11-29 NOTE — Progress Notes (Signed)
MRN: [id number] DOB: 04-19-1966  Subjective:   Devon Bray is a 52 y.o. male presenting for chief complaint of cough/congestion(self pay) .  Reports just over one week history of worsening illness. Started out with dry hacking cough, which became productive (greensish yellow phlegm production). Was feeling better but then yesterday developed body aches, fever, nasal congestion, runny nose, and worsening cough. Has taken over-the-counter cough and cold medication consistently since it started with no relief.  Sick exposure to son and dad who had bronchitis.  Denies chest pain, shortness of breath, heart palpitations, wheezing, sinus pain, ear pain, sore throat, nausea, vomiting, diarrhea.  Patient had flu shot this season.  Past medical history of rectal cancer and development, currently in remission.  No PMH of seasonal allergies or asthma.  Denies smoking.Denies any other aggravating or relieving factors, no other questions or concerns.  Review of Systems  Constitutional: Negative for diaphoresis and weight loss.  Respiratory: Negative for hemoptysis.   Gastrointestinal: Negative for abdominal pain.  Neurological: Negative for dizziness.    Devon Bray has a current medication list which includes the following prescription(s): fish oil-omega-3 fatty acids, oxycodone, probiotic product, azithromycin, benzonatate, epinephrine, ibuprofen, multivitamin, and promethazine-dextromethorphan. Also is allergic to bee venom; beef-derived products; pork-derived products; and penicillins.  Devon Bray  has a past medical history of Cancer ([place]) (12/10/10), GERD (gastroesophageal reflux disease), radiation therapy (02/10/11 to 03/20/11), Inguinal hernia, Inguinal hernia, and Rectal cancer ([place]). Also  has a past surgical history that includes Rectal surgery; insert port a cath; thrombosed hemorroid; Port-a-cath removal (12/22/2011); Hernia repair (Left, 2013); Inguinal hernia repair (Right, 10/29/2017); and Insertion of  mesh (Right, 10/29/2017).   Objective:   Vitals: BP 124/80 (BP Location: Right Arm, Patient Position: Sitting, Cuff Size: Normal)   Pulse 94   Temp 99.5 F (37.5 C) (Oral)   Resp 20   Wt 195 lb 12.8 oz (88.8 kg)   SpO2 99%   BMI 26.56 kg/m   Physical Exam Vitals signs reviewed.  Constitutional:      General: He is not in acute distress.    Appearance: He is well-developed. He is not toxic-appearing.     Comments: Appears like he does not feel well, no acute distress.  HENT:     Head: Normocephalic and atraumatic.     Right Ear: Tympanic membrane, ear canal and external ear normal.     Left Ear: Tympanic membrane, ear canal and external ear normal.     Nose: Mucosal edema, congestion and rhinorrhea ( Clear) present.     Right Sinus: No maxillary sinus tenderness or frontal sinus tenderness.     Left Sinus: No maxillary sinus tenderness or frontal sinus tenderness.     Mouth/Throat:     Pharynx: Oropharynx is clear. Uvula midline. No uvula swelling.     Tonsils: No tonsillar exudate or tonsillar abscesses. Swelling: 1+ on the right. 1+ on the left.  Eyes:     Conjunctiva/sclera: Conjunctivae normal.  Neck:     Musculoskeletal: Normal range of motion.  Cardiovascular:     Rate and Rhythm: Normal rate and regular rhythm.     Heart sounds: Normal heart sounds.  Pulmonary:     Effort: Pulmonary effort is normal.     Breath sounds: Rhonchi (Few rhonchi auscultated in left posterior lung field, cleared with cough.) present. No decreased breath sounds, wheezing or rales.  Lymphadenopathy:     Head:     Right side of head: No submental, submandibular, tonsillar, preauricular, posterior auricular  or occipital adenopathy.     Left side of head: No submental, submandibular, tonsillar, preauricular, posterior auricular or occipital adenopathy.     Cervical: No cervical adenopathy.     Upper Body:     Right upper body: No supraclavicular adenopathy.     Left upper body: No  supraclavicular adenopathy.  Skin:    General: Skin is warm and dry.  Neurological:     Mental Status: He is alert and oriented to person, place, and time.     Results for orders placed or performed in visit on 11/29/18 (from the past 24 hour(s))  POCT Influenza A/B     Status: Normal   Collection Time: 11/29/18  2:34 PM  Result Value Ref Range   Influenza A, POC Negative Negative   Influenza B, POC Negative Negative    Assessment and Plan :  1. Lower resp. tract infection Patient is overall well-appearing, no acute distress.  Vital signs stable.  Point-of-care flu negative.  His history is concerning for secondary sickening.  Recommend treating with oral antibiotic at this time to cover for atypical lung etiology.  Also provided symptomatic relief.  Patient encouraged to follow-up with family doctor if no improvement with treatment plan or sooner if symptoms worsen/develops new concerning symptoms.  Patient voices understanding. - azithromycin (ZITHROMAX) 250 MG tablet; Take 2 tabs PO x 1 dose, then 1 tab PO QD x 4 days  Dispense: 6 tablet; Refill: 0 - benzonatate (TESSALON) 100 MG capsule; Take 1-2 capsules (100-200 mg total) by mouth 3 (three) times daily as needed for cough.  Dispense: 40 capsule; Refill: 0 - promethazine-dextromethorphan (PROMETHAZINE-DM) 6.25-15 MG/5ML syrup; Take 5 mLs by mouth 4 (four) times daily as needed for cough.  Dispense: 118 mL; Refill: 0 - ipratropium (ATROVENT) 0.03 % nasal spray; Place 2 sprays into both nostrils 2 (two) times daily.  Dispense: 30 mL; Refill: 0  2. Cough - POCT Influenza A/B    Tenna Delaine, PA-C  [facility] 11/29/2018 2:34 PM

## 2019-01-18 DIAGNOSIS — Z91018 Allergy to other foods: Secondary | ICD-10-CM | POA: Diagnosis not present

## 2019-01-18 DIAGNOSIS — E7800 Pure hypercholesterolemia, unspecified: Secondary | ICD-10-CM | POA: Diagnosis not present

## 2019-01-18 DIAGNOSIS — Z125 Encounter for screening for malignant neoplasm of prostate: Secondary | ICD-10-CM | POA: Diagnosis not present

## 2019-01-18 DIAGNOSIS — Z0000 Encounter for general adult medical examination without abnormal findings: Secondary | ICD-10-CM | POA: Diagnosis not present

## 2019-01-18 DIAGNOSIS — Z85048 Personal history of other malignant neoplasm of rectum, rectosigmoid junction, and anus: Secondary | ICD-10-CM | POA: Diagnosis not present

## 2020-08-20 ENCOUNTER — Other Ambulatory Visit: Payer: Self-pay | Admitting: Family Medicine

## 2020-08-20 ENCOUNTER — Ambulatory Visit
Admission: RE | Admit: 2020-08-20 | Discharge: 2020-08-20 | Disposition: A | Discharge status: Home / Self Care | Payer: 59 | Source: Ambulatory Visit | Attending: Family Medicine | Admitting: Family Medicine

## 2020-08-20 DIAGNOSIS — N5089 Other specified disorders of the male genital organs: Secondary | ICD-10-CM

## 2020-08-21 ENCOUNTER — Other Ambulatory Visit: Payer: Self-pay | Admitting: Family Medicine

## 2022-03-24 NOTE — Progress Notes (Signed)
?  ?Cardiology Office Note ? ? ?Date:  03/26/2022  ? ?ID:  Devon Bray, DOB 08-19-1966, MRN [id number] ? ?PCP:  Shirline Frees, MD  ? ? ?Chief Complaint  ?Patient presents with  ? Establish Care  ? ?Family h/o CAD ? ?Wt Readings from Last 3 Encounters:  ?03/26/22 206 lb (93.4 kg)  ?11/29/18 195 lb 12.8 oz (88.8 kg)  ?10/29/17 184 lb (83.5 kg)  ?  ? ?  ?History of Present Illness: ?Devon Bray is a 56 y.o. male who is being seen today for the evaluation of family h/o CAD, DOE at the request of Shirline Frees, MD.  His father was my patient and had CABG.  Father ultimately passed away after a stroke. ? ?He walks a lot at work at [employer] and has had some DOE over the past year. He has had some sinus issues as well.  ? ?He does go to the gym and does some weights and cardio.  He feels ok at the gym.  He does have the DOE sensation.  ? ?Denies :  Dizziness. Leg edema. Nitroglycerin use. Orthopnea. Palpitations. Paroxysmal nocturnal dyspnea. Resiting Shortness of breath. Syncope.   ? ?Past Medical History:  ?Diagnosis Date  ? Cancer ([place]) 12/10/10  ? rectal  ? GERD (gastroesophageal reflux disease)   ? Hx of radiation therapy 02/10/11 to 03/20/11  ? rectum  ? Inguinal hernia   ? Inguinal hernia   ? right  ? Rectal cancer ([place])   ? rectal ca dx 10/2010  ? ? ?Past Surgical History:  ?Procedure Laterality Date  ? HERNIA REPAIR Left 2013  ? INGUINAL HERNIA REPAIR Right 10/29/2017  ? Procedure: REPAIR RIGHT HERNIA  INGUINAL WITH MESH ERAS PATHWAY;  Surgeon: Erroll Luna, MD;  Location: [facility];  Service: General;  Laterality: Right;  ? insert port a cath    ? INSERTION OF MESH Right 10/29/2017  ? Procedure: INSERTION OF MESH;  Surgeon: Erroll Luna, MD;  Location: [facility];  Service: General;  Laterality: Right;  ? PORT-A-CATH REMOVAL  12/22/2011  ? Procedure: REMOVAL PORT-A-CATH;  Surgeon: Joyice Faster. Cornett, MD;  Location: [facility];  Service: General;  Laterality:  N/A;  ? RECTAL SURGERY    ? thrombosed hemorroid    ? ? ? ?Current Outpatient Medications  ?Medication Sig Dispense Refill  ? EPINEPHrine 0.3 mg/0.3 mL IJ SOAJ injection Inject 0.3 mg into the muscle once.    ? fish oil-omega-3 fatty acids 1000 MG capsule Take 1 g by mouth daily.     ? Multiple Vitamin (MULTIVITAMIN) tablet Take 1 tablet by mouth daily.    ? Probiotic Product (ALIGN PO) Take 1 tablet by mouth daily.    ? triamcinolone (NASACORT) 55 MCG/ACT AERO nasal inhaler Place 1 spray into the nose daily.    ? ?No current facility-administered medications for this visit.  ? ? ?Allergies:   Bee venom, Beef-derived products, Penicillins, and Pork-derived products  ? ? ?Social History:  The patient  reports that he has never smoked. He has never been exposed to tobacco smoke. He has never used smokeless tobacco. He reports that he does not drink alcohol and does not use drugs.  ? ?Family History:  The patient's family history includes Breast cancer in his mother; Cancer in his father, maternal aunt, maternal grandmother, and paternal aunt; Cancer (age of onset: 36) in his paternal uncle; Heart disease in his father; Lung cancer in his maternal grandmother; Lymphoma in  his maternal aunt; Stroke in his father.  ? ? ?ROS:  Please see the history of present illness.   Otherwise, review of systems are positive for dyspnea on exertion and weight gain.   All other systems are reviewed and negative.  ? ? ?PHYSICAL EXAM: ?VS:  BP 122/82   Pulse 63   Ht 6' (1.829 m)   Wt 206 lb (93.4 kg)   SpO2 98%   BMI 27.94 kg/m?  , BMI Body mass index is 27.94 kg/m?. ?GEN: Well nourished, well developed, in no acute distress ?HEENT: normal ?Neck: no JVD, carotid bruits, or masses ?Cardiac: RRR; no murmurs, rubs, or gallops,no edema  ?Respiratory:  clear to auscultation bilaterally, normal work of breathing ?GI: soft, nontender, nondistended, + BS ?MS: no deformity or atrophy ?Skin: warm and dry, no rash ?Neuro:  Strength and  sensation are intact ?Psych: euthymic mood, full affect ? ? ?EKG:   ?The ekg ordered today demonstrates normal sinus rhythm, borderline left axis deviation ? ? ?Recent Labs: ?No results found for requested labs within last 8760 hours.  ? ?Lipid Panel ?No results found for: CHOL, TRIG, HDL, CHOLHDL, VLDL, LDLCALC, LDLDIRECT ?  ?Other studies Reviewed: ?Additional studies/ records that were reviewed today with results demonstrating: Total cholesterol 220, HDL 52, LDL 154, triglycerides 80, creatinine 0.89. ? ? ?ASSESSMENT AND PLAN: ? ?Family h/o CAD: Father with CABG. Preventive therapy stressed. Father ultimately passed away after a stroke. ?DOE: Checking a CT angiogram of the heart to evaluate for coronary artery disease and for coronary artery calcium.  The calcium score could also guide our preventive therapy recommendations.  Has had radiation, chemo for colon CA.  Not sure if there was radiation to the chest at all but this could be a factor in CAD as well.  Will use metoprolol 50 mg x 1.  Resting heart rate already in the low 60s. ?Hyperlipidemia: Never took any kind of cholesterol-lowering medicine.  A whole food, plant-based diet.  Avoid processed foods.  High fiber diet. Unable to eat red meat due to tick bite.  Depending on calcium score, may need to start statin therapy. ? ? ?Current medicines are reviewed at length with the patient today.  The patient concerns regarding his medicines were addressed. ? ?The following changes have been made:  No change ? ?Labs/ tests ordered today include:  ?No orders of the defined types were placed in this encounter. ? ? ?Recommend 150 minutes/week of aerobic exercise ?Low fat, low carb, high fiber diet recommended ? ?Disposition:   FU in 1 year, or sooner if the CT is abnormal ? ? ?Signed, ?Larae Grooms, MD  ?03/26/2022 8:44 AM    ?[facility] ?[address], [place], [place]  [address] ?Phone: [phone]; Fax: [phone]  ? ?

## 2022-03-26 ENCOUNTER — Encounter: Payer: Self-pay | Admitting: Interventional Cardiology

## 2022-03-26 ENCOUNTER — Ambulatory Visit: Payer: 59 | Admitting: Interventional Cardiology

## 2022-03-26 VITALS — BP 122/82 | HR 63 | Ht 72.0 in | Wt 206.0 lb

## 2022-03-26 DIAGNOSIS — E782 Mixed hyperlipidemia: Secondary | ICD-10-CM | POA: Diagnosis not present

## 2022-03-26 DIAGNOSIS — R0609 Other forms of dyspnea: Secondary | ICD-10-CM

## 2022-03-26 DIAGNOSIS — Z8249 Family history of ischemic heart disease and other diseases of the circulatory system: Secondary | ICD-10-CM

## 2022-03-26 MED ORDER — METOPROLOL TARTRATE 50 MG PO TABS: ORAL_TABLET | ORAL | 0 refills | Status: DC

## 2022-03-26 NOTE — Patient Instructions (Addendum)
Medication Instructions:  ?Your physician recommends that you continue on your current medications as directed. Please refer to the Current Medication list given to you today. ? ?*If you need a refill on your cardiac medications before your next appointment, please call your pharmacy* ? ? ?Lab Work: ?Lab work to be done today--BMP ?If you have labs (blood work) drawn today and your tests are completely normal, you will receive your results only by: ?MyChart Message (if you have MyChart) OR ?A paper copy in the mail ?If you have any lab test that is abnormal or we need to change your treatment, we will call you to review the results. ? ? ?Testing/Procedures: ?Your physician has requested that you have cardiac CT. Cardiac computed tomography (CT) is a painless test that uses an x-ray machine to take clear, detailed pictures of your heart. For further information please visit [email or web address]. Please follow instruction sheet as given. ? ? ? ? ?Follow-Up: ?At [facility], you and your health needs are our priority.  As part of our continuing mission to provide you with exceptional heart care, we have created designated Provider Care Teams.  These Care Teams include your primary Cardiologist (physician) and Advanced Practice Providers (APPs -  Physician Assistants and Nurse Practitioners) who all work together to provide you with the care you need, when you need it. ? ?We recommend signing up for the patient portal called "MyChart".  Sign up information is provided on this After Visit Summary.  MyChart is used to connect with patients for Virtual Visits (Telemedicine).  Patients are able to view lab/test results, encounter notes, upcoming appointments, etc.  Non-urgent messages can be sent to your provider as well.   ?To learn more about what you can do with MyChart, go to [email or web address].   ? ?Your next appointment:   ?12 month(s) ? ?The format for your next appointment:   ?In Person ? ?Provider:    ?Larae Grooms, MD   ? ? ?Other Instructions ? ? ?Your cardiac CT will be scheduled at one of the below locations:  ? ?[facility] ?[address] ?[place], [place] [address] ?[phone] ? ?OR ? ?[place] ?[address] ?[address] ?[place], [place] [address] ?[phone] ? ?If scheduled at [facility], please arrive at the [facility] and Children's Entrance (Entrance C2) of [facility] 30 minutes prior to test start time. ?You can use the FREE valet parking offered at entrance C (encouraged to control the heart rate for the test)  ?Proceed to the [facility] Radiology Department (first floor) to check-in and test prep. ? ?All radiology patients and guests should use entrance C2 at [facility], accessed from [facility], even though the hospital's physical address listed is [address] ? ? ? ?If scheduled at [facility], please arrive 15 mins early for check-in and test prep. ? ?Please follow these instructions carefully (unless otherwise directed): ? ?Hold all erectile dysfunction medications at least 3 days (72 hrs) prior to test. ? ?On the Night Before the Test: ?Be sure to Drink plenty of water. ?Do not consume any caffeinated/decaffeinated beverages or chocolate 12 hours prior to your test. ?Do not take any antihistamines 12 hours prior to your test. ? ? ?On the Day of the Test: ?Drink plenty of water until 1 hour prior to the test. ?Do not eat any food 4 hours prior to the test. ?You may take your regular medications prior to  the test.  ?Take metoprolol (Lopressor) two hours prior to test. ?HOLD Furosemide/Hydrochlorothiazide morning of the test. ?FEMALES- please wear underwire-free bra if available, avoid dresses & tight clothing ? ?     ?After the Test: ?Drink plenty of water. ?After receiving IV contrast, you may experience a mild flushed feeling. This is normal. ?On occasion, you  may experience a mild rash up to 24 hours after the test. This is not dangerous. If this occurs, you can take Benadryl 25 mg and increase your fluid intake. ?If you experience trouble breathing, this can be serious. If it is severe call 911 IMMEDIATELY. If it is mild, please call our office. ?If you take any of these medications: Glipizide/Metformin, Avandament, Glucavance, please do not take 48 hours after completing test unless otherwise instructed. ? ?We will call to schedule your test 2-4 weeks out understanding that some insurance companies will need an authorization prior to the service being performed.  ? ?For non-scheduling related questions, please contact the cardiac imaging nurse navigator should you have any questions/concerns: ?Marchia Bond, Cardiac Imaging Nurse Navigator ?Gordy Clement, Cardiac Imaging Nurse Navigator ?[facility] ?Direct Office Dial: [phone]  ? ?For scheduling needs, including cancellations and rescheduling, please call [place], [phone]. ? ? ?Important Information About Sugar ? ? ? ? ? ? ?

## 2022-03-27 LAB — BASIC METABOLIC PANEL
BUN/Creatinine Ratio: 16 (ref 9–20)
BUN: 16 mg/dL (ref 6–24)
CO2: 23 mmol/L (ref 20–29)
Calcium: 9.2 mg/dL (ref 8.7–10.2)
Chloride: 104 mmol/L (ref 96–106)
Creatinine, Ser: 1.02 mg/dL (ref 0.76–1.27)
Glucose: 92 mg/dL (ref 70–99)
Potassium: 4.2 mmol/L (ref 3.5–5.2)
Sodium: 142 mmol/L (ref 134–144)
eGFR: 86 mL/min/{1.73_m2} (ref >59)

## 2022-04-07 ENCOUNTER — Telehealth (HOSPITAL_COMMUNITY): Payer: Self-pay | Admitting: *Deleted

## 2022-04-07 NOTE — Telephone Encounter (Signed)
Reaching out to patient to offer assistance regarding upcoming cardiac imaging study; pt verbalizes understanding of appt date/time, parking situation and where to check in, pre-test NPO status and medications ordered, and verified current allergies; name and call back number provided for further questions should they arise ? ?Gordy Clement RN Navigator Cardiac Imaging ?[facility] ?[phone] office ?[phone] cell ? ?Patient to take '25mg'$  metoprolol tartrate two hours prior to his cardiac CT scan.  He is aware to arrive at 7:15am. ?

## 2022-04-09 ENCOUNTER — Ambulatory Visit (HOSPITAL_COMMUNITY)
Admission: RE | Admit: 2022-04-09 | Discharge: 2022-04-09 | Disposition: A | Discharge status: Home / Self Care | Payer: 59 | Source: Ambulatory Visit | Attending: Interventional Cardiology | Admitting: Interventional Cardiology

## 2022-04-09 ENCOUNTER — Encounter (HOSPITAL_COMMUNITY): Payer: Self-pay

## 2022-04-09 DIAGNOSIS — I2510 Atherosclerotic heart disease of native coronary artery without angina pectoris: Secondary | ICD-10-CM | POA: Diagnosis not present

## 2022-04-09 DIAGNOSIS — R0609 Other forms of dyspnea: Secondary | ICD-10-CM | POA: Insufficient documentation

## 2022-04-09 DIAGNOSIS — E782 Mixed hyperlipidemia: Secondary | ICD-10-CM | POA: Insufficient documentation

## 2022-04-09 MED ORDER — NITROGLYCERIN 0.4 MG SL SUBL
0.8000 mg | SUBLINGUAL_TABLET | Freq: Once | SUBLINGUAL | Status: AC
Start: 2022-04-09 — End: 2022-04-09
  Administered 2022-04-09: 0.8 mg via SUBLINGUAL

## 2022-04-09 MED ORDER — IOHEXOL 350 MG/ML SOLN
100.0000 mL | Freq: Once | INTRAVENOUS | Status: AC | PRN
  Administered 2022-04-09: 100 mL via INTRAVENOUS

## 2022-04-09 MED ORDER — NITROGLYCERIN 0.4 MG SL SUBL
SUBLINGUAL_TABLET | SUBLINGUAL | Status: AC
  Filled 2022-04-09: qty 2

## 2022-04-10 ENCOUNTER — Telehealth: Payer: Self-pay | Admitting: *Deleted

## 2022-04-10 DIAGNOSIS — R0609 Other forms of dyspnea: Secondary | ICD-10-CM

## 2022-04-10 MED ORDER — ROSUVASTATIN CALCIUM 10 MG PO TABS
10.0000 mg | ORAL_TABLET | Freq: Every day | ORAL | 3 refills | Status: DC
Start: 2022-04-10 — End: 2023-04-14

## 2022-04-10 NOTE — Telephone Encounter (Signed)
-----   Message from Richmond Campbell, LPN sent at 5/75/0518  8:50 AM EDT ----- ? ?----- Message ----- ?From: Jettie Booze, MD ?Sent: 04/09/2022  12:33 PM EDT ?To: Thompson Grayer, RN, Cv Div Ch St Triage ? ?Mild CAD.  No severe blockages.  Heart chambers appears slightly enlarged.  Please order echo and we will get a better sense of the size and pumping for his heart.  Would be reasonable to start rosuvastatin 10 mg daily.  Lipids and liver tests in 3 months.  ? ?

## 2022-04-10 NOTE — Telephone Encounter (Signed)
Patient notified.  Prescription sent to [facility] on [address].  Patient will come in for fasting lab work on July 07, 2022.  He is aware he will be called with echo appointment.  ?

## 2022-04-23 ENCOUNTER — Ambulatory Visit (HOSPITAL_COMMUNITY): Payer: 59 | Attending: Cardiovascular Disease

## 2022-04-23 DIAGNOSIS — R0609 Other forms of dyspnea: Secondary | ICD-10-CM | POA: Diagnosis present

## 2022-04-23 LAB — ECHOCARDIOGRAM COMPLETE
Area-P 1/2: 2.05 cm2
S' Lateral: 3.6 cm

## 2022-04-24 ENCOUNTER — Telehealth: Payer: Self-pay | Admitting: Interventional Cardiology

## 2022-04-24 NOTE — Telephone Encounter (Signed)
Patient returned call for his echo results 

## 2022-04-24 NOTE — Telephone Encounter (Signed)
I spoke with patient and reviewed echo results with him 

## 2022-07-07 ENCOUNTER — Other Ambulatory Visit: Payer: 59

## 2022-07-07 DIAGNOSIS — R0609 Other forms of dyspnea: Secondary | ICD-10-CM

## 2022-07-07 LAB — LIPID PANEL
Chol/HDL Ratio: 2.7 ratio (ref 0.0–5.0)
Cholesterol, Total: 138 mg/dL (ref 100–199)
HDL: 52 mg/dL (ref >39)
LDL Chol Calc (NIH): 75 mg/dL (ref 0–99)
Triglycerides: 48 mg/dL (ref 0–149)
VLDL Cholesterol Cal: 11 mg/dL (ref 5–40)

## 2022-07-07 LAB — HEPATIC FUNCTION PANEL
ALT: 22 IU/L (ref 0–44)
AST: 21 IU/L (ref 0–40)
Albumin: 4.3 g/dL (ref 3.8–4.9)
Alkaline Phosphatase: 76 IU/L (ref 44–121)
Bilirubin Total: 0.3 mg/dL (ref 0.0–1.2)
Bilirubin, Direct: 0.12 mg/dL (ref 0.00–0.40)
Total Protein: 6.9 g/dL (ref 6.0–8.5)

## 2022-11-17 ENCOUNTER — Inpatient Hospital Stay: Payer: 59

## 2022-11-17 ENCOUNTER — Other Ambulatory Visit: Payer: Self-pay

## 2022-11-17 ENCOUNTER — Encounter: Payer: Self-pay | Admitting: Licensed Clinical Social Worker

## 2022-11-17 ENCOUNTER — Other Ambulatory Visit: Payer: Self-pay | Admitting: Licensed Clinical Social Worker

## 2022-11-17 ENCOUNTER — Inpatient Hospital Stay: Payer: 59 | Attending: Genetic Counselor | Admitting: Licensed Clinical Social Worker

## 2022-11-17 DIAGNOSIS — Z85048 Personal history of other malignant neoplasm of rectum, rectosigmoid junction, and anus: Secondary | ICD-10-CM | POA: Diagnosis not present

## 2022-11-17 DIAGNOSIS — Z803 Family history of malignant neoplasm of breast: Secondary | ICD-10-CM

## 2022-11-17 DIAGNOSIS — Z801 Family history of malignant neoplasm of trachea, bronchus and lung: Secondary | ICD-10-CM

## 2022-11-17 DIAGNOSIS — C20 Malignant neoplasm of rectum: Secondary | ICD-10-CM

## 2022-11-17 DIAGNOSIS — Z8042 Family history of malignant neoplasm of prostate: Secondary | ICD-10-CM

## 2022-11-17 DIAGNOSIS — Z800 Family history of malignant neoplasm of digestive organs: Secondary | ICD-10-CM

## 2022-11-17 LAB — GENETIC SCREENING ORDER

## 2022-11-17 NOTE — Progress Notes (Signed)
REFERRING PROVIDER: Self-referred  PRIMARY PROVIDER:  Shirline Frees, MD  PRIMARY REASON FOR VISIT:  1. History of rectal cancer   2. Family history of breast cancer   3. Family history of colon cancer   4. Family history of prostate cancer   5. Family history of lung cancer      HISTORY OF PRESENT ILLNESS:   Devon Bray, a 56 y.o. male, was seen for a [place] cancer genetics consultation due to a personal history of rectal cancer.  Devon Bray presents to clinic today to discuss the possibility of a hereditary predisposition to cancer, genetic testing, and to further clarify his future cancer risks, as well as potential cancer risks for family members.   CANCER HISTORY:  In 2012, at the age of 49, Devon Bray was diagnosed with rectal cancer. This was treated with surgical resection, adjuvant radiation and adjuvant chemotherapy. He reports having polyps on colonoscopies, unsure if more than 10 total. His GI provider is Dr. Michail Sermon.   Past Medical History:  Diagnosis Date   Cancer ([place]) 12/10/10   rectal   GERD (gastroesophageal reflux disease)    Hx of radiation therapy 02/10/11 to 03/20/11   rectum   Inguinal hernia    Inguinal hernia    right   Rectal cancer ([place])    rectal ca dx 10/2010    Past Surgical History:  Procedure Laterality Date   HERNIA REPAIR Left 2013   INGUINAL HERNIA REPAIR Right 10/29/2017   Procedure: REPAIR RIGHT HERNIA  INGUINAL WITH MESH ERAS PATHWAY;  Surgeon: Erroll Luna, MD;  Location: [facility];  Service: General;  Laterality: Right;   insert port a cath     INSERTION OF MESH Right 10/29/2017   Procedure: INSERTION OF MESH;  Surgeon: Erroll Luna, MD;  Location: [facility];  Service: General;  Laterality: Right;   PORT-A-CATH REMOVAL  12/22/2011   Procedure: REMOVAL PORT-A-CATH;  Surgeon: Joyice Faster. Cornett, MD;  Location: [facility];  Service: General;  Laterality: N/A;   RECTAL SURGERY     thrombosed  hemorroid      FAMILY HISTORY:  We obtained a detailed, 4-generation family history.  Significant diagnoses are listed below: Family History  Problem Relation Age of Onset   Breast cancer Mother 17   Heart disease Father    Prostate cancer Father 20   Stroke Father    Lymphoma Maternal Aunt        mat half aunt, dx 34   Lung cancer Maternal Aunt        mat half aunt   Lung cancer Paternal Aunt    Colon cancer Paternal Uncle 61   Lung cancer Maternal Grandmother        dx 74s   Breast cancer Cousin        dx 24s   Devon Bray has 2 sons, 79 and 32, no cancers.  Devon Bray mother had breast cancer at 38 and is living at 20, she did not have genetic testing. Patient has 1 maternal half aunt who had lung cancer in her 37s, and a maternal half aunt who had lymphoma at 48. Maternal grandmother had lung cancer in her 43s.  Devon Bray father had prostate cancer at 40 and passed at 48. Patient had 1 paternal uncle that had colon cancer at 50, his daughter, patient's cousin, had breast cancer in her 25s. A paternal aunt had lung cancer. Paternal grandmother passed at 11, grandfather passed in his 81s.  Devon Bray is unaware of previous family history of genetic testing for hereditary cancer risks. There is no reported Ashkenazi Jewish ancestry. There is no known consanguinity.   GENETIC COUNSELING ASSESSMENT: Devon Bray is a 56 y.o. male with a personal history of rectal cancer at 85 and family history of cancer which is somewhat suggestive of a hereditary cancer syndrome and predisposition to cancer. We, therefore, discussed and recommended the following at today's visit.   DISCUSSION: We discussed that approximately 10% of colorectal cancer is hereditary. Most cases of hereditary colorectal cancer are associated with Lynch syndrome genes, although there are other genes associated with hereditary cancer as well. Cancers and risks are gene specific. We discussed that testing  is beneficial for several reasons including knowing about cancer risks, identifying potential screening and risk-reduction options that may be appropriate, and to understand if other family members could be at risk for cancer and allow them to undergo genetic testing.   We reviewed the characteristics, features and inheritance patterns of hereditary cancer syndromes. We also discussed genetic testing, including the appropriate family members to test, the process of testing, insurance coverage and turn-around-time for results. We discussed the implications of a negative, positive and/or variant of uncertain significant result. We recommended Devon Bray pursue genetic testing for the [facility] Multi-Cancer+RNA gene panel.   Based on Devon Bray personal and family history of cancer, he meets medical criteria for genetic testing. Despite that he meets criteria, he may still have an out of pocket cost. We discussed that if his out of pocket cost for testing is over $100, the laboratory will call and confirm whether he wants to proceed with testing.  If the out of pocket cost of testing is less than $100 he will be billed by the genetic testing laboratory.   PLAN: After considering the risks, benefits, and limitations, Devon Bray provided informed consent to pursue genetic testing and the blood sample was sent to [facility] for analysis of the Multi-Cancer+RNA panel. Results should be available within approximately 2-3 weeks' time, at which point they will be disclosed by telephone to Devon Bray, as will any additional recommendations warranted by these results. Devon Bray will receive a summary of his genetic counseling visit and a copy of his results once available. This information will also be available in Epic.   Devon Bray questions were answered to his satisfaction today. Our contact information was provided should additional questions or concerns arise. Thank you for the  referral and allowing [place] to share in the care of your patient.   Faith Rogue, MS, [facility] Genetic Counselor [email or web address] Phone: [phone]  The patient was seen for a total of 25 minutes in face-to-face genetic counseling.  Dr. Grayland Ormond was available for discussion regarding this case.   _______________________________________________________________________ For Office Staff:  Number of people involved in session: 1 Was an Intern/ student involved with case: no

## 2022-11-21 ENCOUNTER — Other Ambulatory Visit: Payer: Self-pay | Admitting: Urology

## 2022-11-21 ENCOUNTER — Other Ambulatory Visit: Payer: Self-pay

## 2022-12-02 ENCOUNTER — Ambulatory Visit: Payer: Self-pay | Admitting: Licensed Clinical Social Worker

## 2022-12-02 ENCOUNTER — Telehealth: Payer: Self-pay | Admitting: Licensed Clinical Social Worker

## 2022-12-02 DIAGNOSIS — Z1379 Encounter for other screening for genetic and chromosomal anomalies: Secondary | ICD-10-CM

## 2022-12-02 NOTE — Telephone Encounter (Signed)
I contacted Devon Bray to discuss his genetic testing results. No pathogenic variants were identified in the 70 genes analyzed. Detailed clinic note to follow.   The test report has been scanned into EPIC and is located under the Molecular Pathology section of the Results Review tab.  A portion of the result report is included below for reference.      Devon Rogue, MS, [facility] Genetic Counselor [email or web address] Phone: [phone]

## 2022-12-02 NOTE — Progress Notes (Signed)
HPI:   Devon Bray was previously seen in the [place] clinic due to a personal and family history of cancer and concerns regarding a hereditary predisposition to cancer. Please refer to our prior cancer genetics clinic note for more information regarding our discussion, assessment and recommendations, at the time. Devon Bray recent genetic test results were disclosed to him, as were recommendations warranted by these results. These results and recommendations are discussed in more detail below.  CANCER HISTORY:  Oncology History   No history exists.    FAMILY HISTORY:  We obtained a detailed, 4-generation family history.  Significant diagnoses are listed below: Family History  Problem Relation Age of Onset   Breast cancer Mother 60   Heart disease Father    Prostate cancer Father 66   Stroke Father    Lymphoma Maternal Aunt        mat half aunt, dx 72   Lung cancer Maternal Aunt        mat half aunt   Lung cancer Paternal Aunt    Colon cancer Paternal Uncle 98   Lung cancer Maternal Grandmother        dx 53s   Breast cancer Cousin        dx 27s    Devon Bray has 2 sons, 20 and 61, no cancers.   Devon Bray mother had breast cancer at 33 and is living at 105, she did not have genetic testing. Patient has 1 maternal half aunt who had lung cancer in her 60s, and a maternal half aunt who had lymphoma at 27. Maternal grandmother had lung cancer in her 61s.   Devon Bray father had prostate cancer at 48 and passed at 44. Patient had 1 paternal uncle that had colon cancer at 32, Devon Bray daughter, patient's cousin, had breast cancer in her 38s. A paternal aunt had lung cancer. Paternal grandmother passed at 51, grandfather passed in Devon Bray 43s.   Devon Bray is unaware of previous family history of genetic testing for hereditary cancer risks. There is no reported Ashkenazi Jewish ancestry. There is no known consanguinity.     GENETIC TEST RESULTS:  The  [facility] Multi-Cancer+RNA Panel found no pathogenic mutations.   The Multi-Cancer + RNA Panel offered by [facility] includes sequencing and/or deletion/duplication analysis of the following 70 genes:  AIP*, ALK, APC*, ATM*, AXIN2*, BAP1*, BARD1*, BLM*, BMPR1A*, BRCA1*, BRCA2*, BRIP1*, CDC73*, CDH1*, CDK4, CDKN1B*, CDKN2A, CHEK2*, CTNNA1*, DICER1*, EPCAM, EGFR, FH*, FLCN*, GREM1, HOXB13, KIT, LZTR1, MAX*, MBD4, MEN1*, MET, MITF, MLH1*, MSH2*, MSH3*, MSH6*, MUTYH*, NF1*, NF2*, NTHL1*, PALB2*, PDGFRA, PMS2*, POLD1*, POLE*, POT1*, PRKAR1A*, PTCH1*, PTEN*, RAD51C*, RAD51D*, RB1*, RET, SDHA*, SDHAF2*, SDHB*, SDHC*, SDHD*, SMAD4*, SMARCA4*, SMARCB1*, SMARCE1*, STK11*, SUFU*, TMEM127*, TP53*, TSC1*, TSC2*, VHL*. RNA analysis is performed for * genes.   The test report has been scanned into EPIC and is located under the Molecular Pathology section of the Results Review tab.  A portion of the result report is included below for reference. Genetic testing reported out on 11/24/2022.     Even though a pathogenic variant was not identified, possible explanations for the cancer in the family may include: There may be no hereditary risk for cancer in the family. The cancers in Devon Bray and/or Devon Bray family may be sporadic/familial or due to other genetic and environmental factors. There may be a gene mutation in one of these genes that current testing methods cannot detect but that chance is small. There could be another gene that has not  yet been discovered, or that we have not yet tested, that is responsible for the cancer diagnoses in the family.  It is also possible there is a hereditary cause for the cancer in the family that Devon Bray did not inherit.  Therefore, it is important to remain in touch with cancer genetics in the future so that we can continue to offer Devon Bray the most up to date genetic testing.   ADDITIONAL GENETIC TESTING:  We discussed with Devon Bray that Devon Bray genetic testing was  fairly extensive.  If there are additional relevant genes identified to increase cancer risk that can be analyzed in the future, we would be happy to discuss and coordinate this testing at that time.    CANCER SCREENING RECOMMENDATIONS:  Devon Bray test result is considered negative (normal).  This means that we have not identified a hereditary cause for Devon Bray personal and family history of cancer at this time.   An individual's cancer risk and medical management are not determined by genetic test results alone. Overall cancer risk assessment incorporates additional factors, including personal medical history, family history, and any available genetic information that may result in a personalized plan for cancer prevention and surveillance. Therefore, it is recommended Devon Bray continue to follow the cancer management and screening guidelines provided by Devon Bray oncology and primary healthcare provider.  RECOMMENDATIONS FOR FAMILY MEMBERS:   Since Devon Bray did not inherit a identifiable mutation in a cancer predisposition gene included on this panel, Devon Bray children could not have inherited a known mutation from him in one of these genes. Individuals in this family might be at some increased risk of developing cancer, over the general population risk, due to the family history of cancer.  Individuals in the family should notify their providers of the family history of cancer. We recommend women in this family have a yearly mammogram beginning at age 25, or 33 years younger than the earliest onset of cancer, an annual clinical breast exam, and perform monthly breast self-exams.  Family members should have colonoscopies by at age 22, or earlier, as recommended by their providers.  FOLLOW-UP:  Lastly, we discussed with Devon Bray that cancer genetics is a rapidly advancing field and it is possible that new genetic tests will be appropriate for him and/or Devon Bray family members in the future. We encouraged him to remain in  contact with cancer genetics on an annual basis so we can update Devon Bray personal and family histories and let him know of advances in cancer genetics that may benefit this family.   Our contact number was provided. Devon Bray questions were answered to Devon Bray satisfaction, and Devon Bray knows Devon Bray is welcome to call us at anytime with additional questions or concerns.    Faith Rogue, MS, [facility] Genetic Counselor [email or web address] Phone: [phone]

## 2022-12-22 ENCOUNTER — Encounter (HOSPITAL_BASED_OUTPATIENT_CLINIC_OR_DEPARTMENT_OTHER): Payer: Self-pay | Admitting: Urology

## 2022-12-22 NOTE — Progress Notes (Signed)
Spoke w/ via phone for pre-op interview--- Devon Bray Lab needs dos----NONE               Lab results------Current EKG in Epic dated 03/2022 COVID test -----patient states asymptomatic no test needed Arrive at -------0630 NPO after MN NO Solid Food.   Med rec completed Medications to take morning of surgery -----NONE Diabetic medication ----- Patient instructed no nail polish to be worn day of surgery Patient instructed to bring photo id and insurance card day of surgery Patient aware to have Driver (ride ) / caregiver   Wife Devon Bray for 24 hours after surgery  Patient Special Instructions ----- Pre-Op special Istructions ----- Patient verbalized understanding of instructions that were given at this phone interview. Patient denies shortness of breath, chest pain, fever, cough at this phone interview.

## 2022-12-29 ENCOUNTER — Ambulatory Visit (HOSPITAL_BASED_OUTPATIENT_CLINIC_OR_DEPARTMENT_OTHER): Payer: 59 | Admitting: Certified Registered"

## 2022-12-29 ENCOUNTER — Encounter (HOSPITAL_BASED_OUTPATIENT_CLINIC_OR_DEPARTMENT_OTHER): Payer: Self-pay | Admitting: Urology

## 2022-12-29 ENCOUNTER — Ambulatory Visit (HOSPITAL_BASED_OUTPATIENT_CLINIC_OR_DEPARTMENT_OTHER)
Admission: RE | Admit: 2022-12-29 | Discharge: 2022-12-29 | Disposition: A | Discharge status: Home / Self Care | Payer: 59 | Attending: Urology | Admitting: Urology

## 2022-12-29 ENCOUNTER — Other Ambulatory Visit: Payer: Self-pay

## 2022-12-29 ENCOUNTER — Encounter (HOSPITAL_BASED_OUTPATIENT_CLINIC_OR_DEPARTMENT_OTHER)
Admission: RE | Disposition: A | Discharge status: Home / Self Care | Payer: Self-pay | Source: Home / Self Care | Attending: Urology

## 2022-12-29 DIAGNOSIS — N433 Hydrocele, unspecified: Secondary | ICD-10-CM | POA: Insufficient documentation

## 2022-12-29 HISTORY — PX: HYDROCELE EXCISION: SHX482

## 2022-12-29 SURGERY — HYDROCELECTOMY
Anesthesia: General | Laterality: Right

## 2022-12-29 MED ORDER — FENTANYL CITRATE (PF) 100 MCG/2ML IJ SOLN
INTRAMUSCULAR | Status: DC | PRN
  Administered 2022-12-29 (×2): 25 ug via INTRAVENOUS
  Administered 2022-12-29: 50 ug via INTRAVENOUS

## 2022-12-29 MED ORDER — DEXAMETHASONE SODIUM PHOSPHATE 10 MG/ML IJ SOLN
INTRAMUSCULAR | Status: AC
  Filled 2022-12-29: qty 1

## 2022-12-29 MED ORDER — DEXAMETHASONE SODIUM PHOSPHATE 10 MG/ML IJ SOLN
INTRAMUSCULAR | Status: DC | PRN
  Administered 2022-12-29: 10 mg via INTRAVENOUS

## 2022-12-29 MED ORDER — OXYCODONE HCL 5 MG PO TABS: 5.0000 mg | ORAL_TABLET | Freq: Once | ORAL | Status: DC | PRN

## 2022-12-29 MED ORDER — OXYCODONE HCL 5 MG/5ML PO SOLN: 5.0000 mg | Freq: Once | ORAL | Status: DC | PRN

## 2022-12-29 MED ORDER — PROPOFOL 10 MG/ML IV BOLUS
INTRAVENOUS | Status: AC
  Filled 2022-12-29: qty 20

## 2022-12-29 MED ORDER — MIDAZOLAM HCL 2 MG/2ML IJ SOLN
INTRAMUSCULAR | Status: AC
  Filled 2022-12-29: qty 2

## 2022-12-29 MED ORDER — TRAMADOL HCL 50 MG PO TABS: 50.0000 mg | ORAL_TABLET | Freq: Four times a day (QID) | ORAL | 0 refills | Status: DC | PRN

## 2022-12-29 MED ORDER — ONDANSETRON HCL 4 MG/2ML IJ SOLN
INTRAMUSCULAR | Status: AC
  Filled 2022-12-29: qty 2

## 2022-12-29 MED ORDER — HYDROMORPHONE HCL 1 MG/ML IJ SOLN: 0.2500 mg | INTRAMUSCULAR | Status: DC | PRN

## 2022-12-29 MED ORDER — LIDOCAINE HCL (PF) 2 % IJ SOLN
INTRAMUSCULAR | Status: AC
  Filled 2022-12-29: qty 5

## 2022-12-29 MED ORDER — PROMETHAZINE HCL 25 MG/ML IJ SOLN: 6.2500 mg | INTRAMUSCULAR | Status: DC | PRN

## 2022-12-29 MED ORDER — LIDOCAINE 2% (20 MG/ML) 5 ML SYRINGE
INTRAMUSCULAR | Status: DC | PRN
  Administered 2022-12-29: 80 mg via INTRAVENOUS

## 2022-12-29 MED ORDER — CIPROFLOXACIN IN D5W 400 MG/200ML IV SOLN
400.0000 mg | INTRAVENOUS | Status: AC
  Administered 2022-12-29: 400 mg via INTRAVENOUS

## 2022-12-29 MED ORDER — MEPERIDINE HCL 25 MG/ML IJ SOLN: 6.2500 mg | INTRAMUSCULAR | Status: DC | PRN

## 2022-12-29 MED ORDER — ONDANSETRON HCL 4 MG/2ML IJ SOLN
INTRAMUSCULAR | Status: DC | PRN
  Administered 2022-12-29: 4 mg via INTRAVENOUS

## 2022-12-29 MED ORDER — CIPROFLOXACIN IN D5W 400 MG/200ML IV SOLN
INTRAVENOUS | Status: AC
  Filled 2022-12-29: qty 200

## 2022-12-29 MED ORDER — PROPOFOL 10 MG/ML IV BOLUS
INTRAVENOUS | Status: DC | PRN
  Administered 2022-12-29: 160 mg via INTRAVENOUS

## 2022-12-29 MED ORDER — FENTANYL CITRATE (PF) 100 MCG/2ML IJ SOLN
INTRAMUSCULAR | Status: AC
  Filled 2022-12-29: qty 2

## 2022-12-29 MED ORDER — BUPIVACAINE HCL (PF) 0.25 % IJ SOLN
INTRAMUSCULAR | Status: DC | PRN
  Administered 2022-12-29: 20 mL

## 2022-12-29 MED ORDER — LACTATED RINGERS IV SOLN: INTRAVENOUS | Status: DC

## 2022-12-29 MED ORDER — MIDAZOLAM HCL 5 MG/5ML IJ SOLN
INTRAMUSCULAR | Status: DC | PRN
  Administered 2022-12-29: 2 mg via INTRAVENOUS

## 2022-12-29 MED ORDER — AMISULPRIDE (ANTIEMETIC) 5 MG/2ML IV SOLN: 10.0000 mg | Freq: Once | INTRAVENOUS | Status: DC | PRN

## 2022-12-29 SURGICAL SUPPLY — 38 items
ADH SKN CLS APL DERMABOND .7 (GAUZE/BANDAGES/DRESSINGS) ×1
BLADE CLIPPER SENSICLIP SURGIC (BLADE) IMPLANT
BLADE SURG 15 STRL LF DISP TIS (BLADE) ×1 IMPLANT
BLADE SURG 15 STRL SS (BLADE) ×1
BNDG GAUZE DERMACEA FLUFF 4 (GAUZE/BANDAGES/DRESSINGS) ×1 IMPLANT
BNDG GZE DERMACEA 4 6PLY (GAUZE/BANDAGES/DRESSINGS) ×1
CLEANER CAUTERY TIP PAD (MISCELLANEOUS) ×1 IMPLANT
COVER BACK TABLE 60X90IN (DRAPES) ×1 IMPLANT
COVER MAYO STAND STRL (DRAPES) ×1 IMPLANT
DERMABOND ADVANCED .7 DNX12 (GAUZE/BANDAGES/DRESSINGS) ×1 IMPLANT
DISSECTOR ROUND CHERRY 3/8 STR (MISCELLANEOUS) IMPLANT
DRAIN PENROSE 0.25X18 (DRAIN) ×1 IMPLANT
DRAIN PENROSE LF 8X20.3CM SIL (WOUND CARE) IMPLANT
DRAPE LAPAROTOMY 100X72 PEDS (DRAPES) ×1 IMPLANT
ELECT NDL TIP 2.8 STRL (NEEDLE) IMPLANT
ELECT NEEDLE TIP 2.8 STRL (NEEDLE) IMPLANT
ELECT REM PT RETURN 9FT ADLT (ELECTROSURGICAL) ×1
ELECTRODE REM PT RTRN 9FT ADLT (ELECTROSURGICAL) IMPLANT
GAUZE 4X4 16PLY ~~LOC~~+RFID DBL (SPONGE) ×1 IMPLANT
GLOVE BIO SURGEON STRL SZ8 (GLOVE) ×1 IMPLANT
GOWN STRL REUS W/TWL XL LVL3 (GOWN DISPOSABLE) ×2 IMPLANT
KIT TURNOVER CYSTO (KITS) ×1 IMPLANT
MANIFOLD NEPTUNE II (INSTRUMENTS) IMPLANT
NEEDLE HYPO 22GX1.5 SAFETY (NEEDLE) IMPLANT
NS IRRIG 500ML POUR BTL (IV SOLUTION) ×1 IMPLANT
PACK BASIN DAY SURGERY FS (CUSTOM PROCEDURE TRAY) ×1 IMPLANT
PENCIL SMOKE EVACUATOR (MISCELLANEOUS) ×1 IMPLANT
SUPPORT SCROTAL LG STRP (MISCELLANEOUS) ×1 IMPLANT
SUT CHROMIC 2 0 SH (SUTURE) ×1 IMPLANT
SUT CHROMIC 3 0 SH 27 (SUTURE) IMPLANT
SUT CHROMIC 4 0 SH 27 (SUTURE) IMPLANT
SUT MNCRL AB 4-0 PS2 18 (SUTURE) ×1 IMPLANT
SYR CONTROL 10ML LL (SYRINGE) IMPLANT
TOWEL OR 17X26 10 PK STRL BLUE (TOWEL DISPOSABLE) ×1 IMPLANT
TRAY DSU PREP LF (CUSTOM PROCEDURE TRAY) ×1 IMPLANT
TUBE CONNECTING 12X1/4 (SUCTIONS) ×1 IMPLANT
WATER STERILE IRR 500ML POUR (IV SOLUTION) ×1 IMPLANT
YANKAUER SUCT BULB TIP NO VENT (SUCTIONS) ×1 IMPLANT

## 2022-12-29 NOTE — Anesthesia Preprocedure Evaluation (Signed)
Anesthesia Evaluation  Patient identified by MRN, date of birth, ID band Patient awake    Reviewed: Allergy & Precautions, NPO status , Patient's Chart, lab work & pertinent test results  Airway Mallampati: II  TM Distance: >3 FB Neck ROM: Full    Dental  (+) Teeth Intact, Dental Advisory Given   Pulmonary neg pulmonary ROS   Pulmonary exam normal breath sounds clear to auscultation       Cardiovascular negative cardio ROS Normal cardiovascular exam Rhythm:Regular Rate:Normal     Neuro/Psych negative neurological ROS  negative psych ROS   GI/Hepatic Neg liver ROS,GERD  ,,Rectal cancer RIH   Endo/Other  negative endocrine ROS    Renal/GU negative Renal ROS     Musculoskeletal negative musculoskeletal ROS (+)    Abdominal   Peds  Hematology negative hematology ROS (+)   Anesthesia Other Findings Day of surgery medications reviewed with the patient.  Reproductive/Obstetrics                             Anesthesia Physical Anesthesia Plan  ASA: II  Anesthesia Plan: General   Post-op Pain Management:  Regional for Post-op pain and Dilaudid IV   Induction: Intravenous  PONV Risk Score and Plan: 2 and Ondansetron, Midazolam and Treatment may vary due to age or medical condition  Airway Management Planned: LMA  Additional Equipment:   Intra-op Plan:   Post-operative Plan: Extubation in OR  Informed Consent: I have reviewed the patients History and Physical, chart, labs and discussed the procedure including the risks, benefits and alternatives for the proposed anesthesia with the patient or authorized representative who has indicated his/her understanding and acceptance.     Dental advisory given  Plan Discussed with: CRNA  Anesthesia Plan Comments: (Risks/benefits of general anesthesia discussed with patient including risk of damage to teeth, lips, gum, and tongue,  nausea/vomiting, allergic reactions to medications, and the possibility of heart attack, stroke and death.  All patient questions answered.  Patient wishes to proceed.)        Anesthesia Quick Evaluation

## 2022-12-29 NOTE — Transfer of Care (Signed)
Immediate Anesthesia Transfer of Care Note  Patient: Devon Bray  Procedure(s) Performed: HYDROCELECTOMY ADULT (Right)  Patient Location: PACU  Anesthesia Type:General  Level of Consciousness: awake, alert , and oriented  Airway & Oxygen Therapy: Patient Spontanous Breathing and Patient connected to nasal cannula oxygen  Post-op Assessment: Report given to RN and Post -op Vital signs reviewed and stable  Post vital signs: Reviewed and stable  Last Vitals:  Vitals Value Taken Time  BP 115/75 12/29/22 0920  Temp    Pulse 67 12/29/22 0921  Resp 15 12/29/22 0921  SpO2 94 % 12/29/22 0921  Vitals shown include unvalidated device data.  Last Pain:  Vitals:   12/29/22 0644  TempSrc: Oral  PainSc: 0-No pain      Patients Stated Pain Goal: 5 (15/48/84 5733)  Complications: No notable events documented.

## 2022-12-29 NOTE — Op Note (Signed)
Preoperative diagnosis: Right hydrocele   Postoperative diagnosis: Same   Procedure:Right hydrocele repair   Surgeon: Lillette Boxer. Robertha Staples, M.D.   Asst: Quillian Quince, MD  Anesthesia: Gen. W/ local  Indications: Patient presented with scrotal swelling. A hydrocele was confirmed with scrotal ultrasonography. The patient was symptomatic from his hydrocele and requested surgical intervention. He appeared to understand the risks benefits potential complications of this procedure.   Procedure: The patient was properly identified and marked in the holding room. He received preoperative IV antibiotics. He was then taken to the operating room where general anesthetic was administered using the LMA. The scrotum was then prepped and draped in the usual manner. Appropriate surgical timeout was performed. An incision was made in the median raphae of the scrotum. The hydrocele was encountered which was freed from the scrotal wall and then the testis and hydrocele sac were delivered from the incision. The hydrocele sac was then incised anteriorly and a large amount of fluid was obtained. The testis was carefully inspected and no other pathology was appreciated. The hydrocele sac was moderate in thickness.  The sac was then plicated posteriorly with a running 3-0 chromic suture. The spermatic cord block was then performed with  20 ml of 0.25% plain Marcaine. The testis was returned to the hemiscrotum taking great care to make sure that there was no twisting of the spermatic cord. Inspection of the inside of the scrotum revealed no significant bleeding. A quarter-inch Penrose was brought through the lower aspect of the scrotum into the affected hemiscrotum and sutured to the skin. The scrotal incision was then closed anatomically, first with a running 3-0 chromic reapproximating the dartos fascia, and then a 4-0 Monocryl used to close the skin in a simple running fashion. A fluff dressing and jockstrap was then placed  Sponge and needle counts were correct. No obvious complications occurred and the patient was brought to recovery room in stable condition.

## 2022-12-29 NOTE — Anesthesia Postprocedure Evaluation (Signed)
Anesthesia Post Note  Patient: Devon Bray  Procedure(s) Performed: HYDROCELECTOMY ADULT (Right)     Patient location during evaluation: PACU Anesthesia Type: General Level of consciousness: awake and alert Pain management: pain level controlled Vital Signs Assessment: post-procedure vital signs reviewed and stable Respiratory status: spontaneous breathing, nonlabored ventilation and respiratory function stable Cardiovascular status: blood pressure returned to baseline and stable Postop Assessment: no apparent nausea or vomiting Anesthetic complications: no   No notable events documented.  Last Vitals:  Vitals:   12/29/22 1000 12/29/22 1015  BP: (!) 136/94   Pulse: 64 71  Resp: 10 13  Temp:    SpO2: 96% 94%    Last Pain:  Vitals:   12/29/22 1015  TempSrc:   PainSc: 0-No pain                 Lynda Rainwater

## 2022-12-29 NOTE — Discharge Instructions (Addendum)
Scrotal surgery postoperative instructions  Wound:  In most cases your incision will have absorbable sutures that will dissolve within the first 2-3 weeks. Some will fall out even earlier. Expect some redness as the sutures dissolve but this should occur only around the sutures. If there is generalized redness, especially with increasing pain or swelling, let us know. The scrotum will very likely get "black and blue" as the blood in the tissues spread. Sometimes the whole scrotum will turn colors. The black and blue is followed by a yellow and brown color. In time, all the discoloration will go away. In some cases some firm swelling in the area of the testicle may persist for up to 4-6 weeks after the surgery and is considered normal in most cases.  Drain:  If the surgeon placed a drain through the bottom part of your scrotum, it is held in with a small stitch. When instructed, cut the small stitch and slide to drain out. Once the drain has been removed, a small hole made drain out for another day or 2. If so, keep a clean washcloth underneath your supportive undergarment, or sterile gauze. Until the hole seals up, all bathing should be in the shower, and not in the bathtub. Remove drain in 5 days--cut stitch and then slide out  Diet:  You may return to your normal diet within 24 hours following your surgery. You may note some mild nausea and possibly vomiting the first 6-8 hours following surgery. This is usually due to the side effects of anesthesia, and will disappear quite soon. I would suggest clear liquids and a very light meal the first evening following your surgery.  Activity:  Your physical activity should be restricted the first 48 hours. During that time you should remain relatively inactive, moving about only when necessary. During the first 7-10 days following surgery you should avoid lifting any heavy objects (anything greater than 15 pounds), and avoid strenuous exercise. If you work,  ask [place] specifically about your restrictions, both for work and home. We will write a note to your employer if needed.  You should plan to wear a tight pair of jockey shorts or an athletic supporter for the first 4-5 days, even to sleep. This will keep the scrotum immobilized to some degree and keep the swelling down. You may find it more comfortable to wear a support longer.  Ice packs should be placed on and off over the scrotum for the first 48 hours. Frozen peas or corn in a ZipLock bag can be frozen, used and re-frozen. Fifteen minutes on and 15 minutes off is a reasonable schedule. The ice is a good pain reliever and keeps the swelling down.  Hygiene:  You may shower 48 hours after your surgery. Tub bathing should be restricted until the seventh day.  Medication:  You will be sent home with some type of pain medication. In many cases you will be sent home with a narcotic pain pill (Vicodin or Tylox). If the pain is not too bad, you may take either Tylenol (acetaminophen) or Advil (ibuprofen) which contain no narcotic agents, and might be tolerated a little better, with fewer side effects. If the pain medication you are sent home with does not control the pain, you will have to let us know. Some narcotic pain medications cannot be given or refilled by a phone call to a pharmacy.  Problems you should report to [place]:  Fever of 101.0 degrees Fahrenheit or greater. Moderate or severe swelling  under the skin incision or involving the scrotum. Drug reaction such as hives, a rash, nausea or vomiting.    Post Anesthesia Home Care Instructions  Activity: Get plenty of rest for the remainder of the day. A responsible individual must stay with you for 24 hours following the procedure.  For the next 24 hours, DO NOT: -Drive a car -Paediatric nurse -Drink alcoholic beverages -Take any medication unless instructed by your physician -Make any legal decisions or sign important  papers.  Meals: Start with liquid foods such as gelatin or soup. Progress to regular foods as tolerated. Avoid greasy, spicy, heavy foods. If nausea and/or vomiting occur, drink only clear liquids until the nausea and/or vomiting subsides. Call your physician if vomiting continues.  Special Instructions/Symptoms: Your throat may feel dry or sore from the anesthesia or the breathing tube placed in your throat during surgery. If this causes discomfort, gargle with warm salt water. The discomfort should disappear within 24 hours.

## 2022-12-29 NOTE — Anesthesia Procedure Notes (Signed)
Procedure Name: LMA Insertion Date/Time: 12/29/2022 8:36 AM  Performed by: Ragna Kramlich D, CRNAPre-anesthesia Checklist: Patient identified, Emergency Drugs available, Suction available and Patient being monitored Patient Re-evaluated:Patient Re-evaluated prior to induction Oxygen Delivery Method: Circle system utilized Preoxygenation: Pre-oxygenation with 100% oxygen Induction Type: IV induction Ventilation: Mask ventilation without difficulty LMA: LMA inserted LMA Size: 5.0 Tube type: Oral Number of attempts: 1 Placement Confirmation: positive ETCO2 and breath sounds checked- equal and bilateral Tube secured with: Tape Dental Injury: Teeth and Oropharynx as per pre-operative assessment

## 2022-12-29 NOTE — H&P (Signed)
H&P  Chief Complaint: Right hydrocele  History of Present Illness: Devon Bray is a 57 year old male presenting for surgical management of a symptomatic right hydrocele documented by ultrasound.  Past Medical History:  Diagnosis Date   Cancer ([place]) 12/10/10   rectal   GERD (gastroesophageal reflux disease)    Hx of radiation therapy 02/10/11 to 03/20/11   rectum   Inguinal hernia    Inguinal hernia    right   Rectal cancer ([place])    rectal ca dx 10/2010    Past Surgical History:  Procedure Laterality Date   HERNIA REPAIR Left 2013   INGUINAL HERNIA REPAIR Right 10/29/2017   Procedure: REPAIR RIGHT HERNIA  INGUINAL WITH MESH ERAS PATHWAY;  Surgeon: Erroll Luna, MD;  Location: [facility];  Service: General;  Laterality: Right;   insert port a cath     INSERTION OF MESH Right 10/29/2017   Procedure: INSERTION OF MESH;  Surgeon: Erroll Luna, MD;  Location: [facility];  Service: General;  Laterality: Right;   PORT-A-CATH REMOVAL  12/22/2011   Procedure: REMOVAL PORT-A-CATH;  Surgeon: Joyice Faster. Cornett, MD;  Location: [facility];  Service: General;  Laterality: N/A;   RECTAL SURGERY     thrombosed hemorroid      Home Medications:    Allergies:  Allergies  Allergen Reactions   Bee Venom    Beef-Derived Products    Penicillins Rash and Other (See Comments)    Had when was a child   Pork-Derived Products     Family History  Problem Relation Age of Onset   Breast cancer Mother 57   Heart disease Father    Prostate cancer Father 42   Stroke Father    Lymphoma Maternal Aunt        mat half aunt, dx 25   Lung cancer Maternal Aunt        mat half aunt   Lung cancer Paternal Aunt    Colon cancer Paternal Uncle 69   Lung cancer Maternal Grandmother        dx 63s   Breast cancer Cousin        dx 67s    Social History:  reports that he has never smoked. He has never been exposed to tobacco smoke. He has never used smokeless tobacco. He reports  that he does not drink alcohol and does not use drugs.  ROS: A complete review of systems was performed.  All systems are negative except for pertinent findings as noted.  Physical Exam:  Vital signs in last 24 hours: BP (!) 133/100   Pulse 81   Temp 97.8 F (36.6 C) (Oral)   Resp 17   Ht 6' (1.829 m)   Wt 92.3 kg   SpO2 96%   BMI 27.59 kg/m  Constitutional:  Alert and oriented, No acute distress Cardiovascular: Regular rate  Respiratory: Normal respiratory effort GI: Abdomen is soft, nontender, nondistended, no abdominal masses. No CVAT.  Genitourinary: Normal male phallus, testes are descended bilaterally and non-tender and without masses, scrotum is normal in appearance without lesions or masses, perineum is normal on inspection. Lymphatic: No lymphadenopathy Neurologic: Grossly intact, no focal deficits Psychiatric: Normal mood and affect  I have reviewed prior pt notes  I have reviewed notes from referring/previous physicians  I have reviewed urinalysis results  I have independently reviewed prior imaging    Impression/Assessment:  Right hydrocele  Plan:  Right hydrocelectomy

## 2022-12-30 ENCOUNTER — Encounter (HOSPITAL_BASED_OUTPATIENT_CLINIC_OR_DEPARTMENT_OTHER): Payer: Self-pay | Admitting: Urology

## 2023-04-14 ENCOUNTER — Other Ambulatory Visit: Payer: Self-pay | Admitting: Interventional Cardiology

## 2023-06-01 ENCOUNTER — Encounter (HOSPITAL_BASED_OUTPATIENT_CLINIC_OR_DEPARTMENT_OTHER): Payer: Self-pay

## 2023-06-01 ENCOUNTER — Emergency Department (HOSPITAL_BASED_OUTPATIENT_CLINIC_OR_DEPARTMENT_OTHER)
Admission: EM | Admit: 2023-06-01 | Discharge: 2023-06-01 | Disposition: A | Discharge status: Home / Self Care | Payer: 59 | Attending: Emergency Medicine | Admitting: Emergency Medicine

## 2023-06-01 ENCOUNTER — Other Ambulatory Visit: Payer: Self-pay

## 2023-06-01 DIAGNOSIS — Z85048 Personal history of other malignant neoplasm of rectum, rectosigmoid junction, and anus: Secondary | ICD-10-CM | POA: Diagnosis not present

## 2023-06-01 DIAGNOSIS — R21 Rash and other nonspecific skin eruption: Secondary | ICD-10-CM | POA: Diagnosis present

## 2023-06-01 DIAGNOSIS — T63441A Toxic effect of venom of bees, accidental (unintentional), initial encounter: Secondary | ICD-10-CM | POA: Insufficient documentation

## 2023-06-01 DIAGNOSIS — T63481A Toxic effect of venom of other arthropod, accidental (unintentional), initial encounter: Secondary | ICD-10-CM

## 2023-06-01 LAB — BASIC METABOLIC PANEL
Anion gap: 9 (ref 5–15)
BUN: 21 mg/dL — ABNORMAL HIGH (ref 6–20)
CO2: 25 mmol/L (ref 22–32)
Calcium: 8.8 mg/dL — ABNORMAL LOW (ref 8.9–10.3)
Chloride: 105 mmol/L (ref 98–111)
Creatinine, Ser: 1.1 mg/dL (ref 0.61–1.24)
GFR, Estimated: >60 mL/min (ref >60)
Glucose, Bld: 105 mg/dL — ABNORMAL HIGH (ref 70–99)
Potassium: 3.8 mmol/L (ref 3.5–5.1)
Sodium: 139 mmol/L (ref 135–145)

## 2023-06-01 LAB — CBC
HCT: 38 % — ABNORMAL LOW (ref 39.0–52.0)
Hemoglobin: 13.1 g/dL (ref 13.0–17.0)
MCH: 30.3 pg (ref 26.0–34.0)
MCHC: 34.5 g/dL (ref 30.0–36.0)
MCV: 87.8 fL (ref 80.0–100.0)
Platelets: 285 10*3/uL (ref 150–400)
RBC: 4.33 MIL/uL (ref 4.22–5.81)
RDW: 12.9 % (ref 11.5–15.5)
WBC: 11.1 10*3/uL — ABNORMAL HIGH (ref 4.0–10.5)
nRBC: 0 % (ref 0.0–0.2)

## 2023-06-01 MED ORDER — PREDNISONE 50 MG PO TABS: 50.0000 mg | ORAL_TABLET | Freq: Every day | ORAL | 0 refills | Status: DC

## 2023-06-01 MED ORDER — METHYLPREDNISOLONE SODIUM SUCC 125 MG IJ SOLR
125.0000 mg | Freq: Once | INTRAMUSCULAR | Status: AC
  Administered 2023-06-01: 125 mg via INTRAVENOUS
  Filled 2023-06-01: qty 2

## 2023-06-01 MED ORDER — FAMOTIDINE IN NACL 20-0.9 MG/50ML-% IV SOLN
20.0000 mg | Freq: Once | INTRAVENOUS | Status: AC
  Administered 2023-06-01: 20 mg via INTRAVENOUS
  Filled 2023-06-01: qty 50

## 2023-06-01 MED ORDER — EPINEPHRINE 0.3 MG/0.3ML IJ SOAJ: 0.3000 mg | INTRAMUSCULAR | 0 refills | Status: AC | PRN

## 2023-06-01 NOTE — ED Triage Notes (Addendum)
Pt got stung by 8-10 yellow jackets and is allergic to bees. Unable to use epi-pen due to it being expired. Pt reports he took 50 mg of benadryl at around 8pm. Went to the fire department and EMS was called. They gave him 50mg  Benadryl again at around 9 pm. Pt denies tingly or scratchy throat. No SOB; speaking in full sentences. Pt reports rash to upper arms, torso, and neck. Only notice redness and swelling to RT ear and redness to RT neck. Pt took tylenol PTA for pain to RT ear.

## 2023-06-01 NOTE — ED Provider Notes (Signed)
[facility] Provider Note   CSN: [id number] Arrival date & time: 06/01/23  2152     History  Chief Complaint  Patient presents with   Insect Bite    Devon Bray is a 57 y.o. male.  Pt is a 57 yo male with pmhx significant for rectal cancer, inguinal hernia, and GERD.  Pt is allergic to bees and was stung multiple times today.  Pt said he was mowing his lawn and ran over a hornet's nest.  He took 50 mg of benadryl around 2000.  He developed hives all over and his was very itchy, so they went to the local EMS station and was given 50 more mg of benadryl around 2100.  Hives have improved.  He does not have sob.  No swollen tongue or itchy throat.  He has an epi pen, but it was expired, so he did not take it.       Home Medications Prior to Admission medications   Medication Sig Start Date End Date Taking? Authorizing Provider  EPINEPHrine 0.3 mg/0.3 mL IJ SOAJ injection Inject 0.3 mg into the muscle as needed for anaphylaxis. 06/01/23  Yes Jacalyn Lefevre, MD  predniSONE (DELTASONE) 50 MG tablet Take 1 tablet (50 mg total) by mouth daily with breakfast. 06/01/23  Yes Jacalyn Lefevre, MD  EPINEPHrine 0.3 mg/0.3 mL IJ SOAJ injection Inject 0.3 mg into the muscle once. 07/03/16   [provider]  fish oil-omega-3 fatty acids 1000 MG capsule Take 1 g by mouth daily.     [provider]  Multiple Vitamin (MULTIVITAMIN) tablet Take 1 tablet by mouth daily.    [provider]  Probiotic Product (ALIGN PO) Take 1 tablet by mouth daily.    [provider]  rosuvastatin (CRESTOR) 10 MG tablet Take 1 tablet (10 mg total) by mouth daily. Keep appt in October for future refills. 04/14/23   Corky Crafts, MD  traMADol (ULTRAM) 50 MG tablet Take 1 tablet (50 mg total) by mouth every 6 (six) hours as needed. 12/29/22   Marcine Matar, MD  triamcinolone (NASACORT) 55 MCG/ACT AERO nasal inhaler Place 1 spray into  the nose daily.    [provider]      Allergies    Bee venom, Beef-derived products, Penicillins, and Pork-derived products    Review of Systems   Review of Systems  Skin:  Positive for rash.  All other systems reviewed and are negative.   Physical Exam Updated Vital Signs BP (!) 142/89   Pulse 69   Temp 98.1 F (36.7 C) (Oral)   Resp 17   Ht 6' (1.829 m)   Wt 88.5 kg   SpO2 98%   BMI 26.45 kg/m  Physical Exam Vitals and nursing note reviewed.  Constitutional:      Appearance: Normal appearance.  HENT:     Head: Normocephalic and atraumatic.     Left Ear: External ear normal.     Ears:     Comments: Right ear and right neck swollen and red secondary to hornet stings    Nose: Nose normal.     Mouth/Throat:     Mouth: Mucous membranes are dry.     Pharynx: Oropharynx is clear.  Eyes:     Extraocular Movements: Extraocular movements intact.     Conjunctiva/sclera: Conjunctivae normal.     Pupils: Pupils are equal, round, and reactive to light.  Cardiovascular:     Rate and Rhythm:  Normal rate and regular rhythm.     Pulses: Normal pulses.     Heart sounds: Normal heart sounds.  Pulmonary:     Effort: Pulmonary effort is normal.     Breath sounds: Normal breath sounds.  Abdominal:     General: Abdomen is flat. Bowel sounds are normal.     Palpations: Abdomen is soft.  Musculoskeletal:        General: Normal range of motion.     Cervical back: Normal range of motion and neck supple.  Skin:    General: Skin is warm.     Capillary Refill: Capillary refill takes less than 2 seconds.     Findings: Rash present. Rash is urticarial.     Comments: Hives to right chest and right arm  Neurological:     General: No focal deficit present.     Mental Status: He is alert and oriented to person, place, and time.  Psychiatric:        Mood and Affect: Mood normal.        Behavior: Behavior normal.     ED Results / Procedures / Treatments   Labs (all labs  ordered are listed, but only abnormal results are displayed) Labs Reviewed  BASIC METABOLIC PANEL - Abnormal; Notable for the following components:      Result Value   Glucose, Bld 105 (*)    BUN 21 (*)    Calcium 8.8 (*)    All other components within normal limits  CBC - Abnormal; Notable for the following components:   WBC 11.1 (*)    HCT 38.0 (*)    All other components within normal limits    EKG None  Radiology No results found.  Procedures Procedures    Medications Ordered in ED Medications  famotidine (PEPCID) IVPB 20 mg premix (20 mg Intravenous New Bag/Given 06/01/23 2238)  methylPREDNISolone sodium succinate (SOLU-MEDROL) 125 mg/2 mL injection 125 mg (125 mg Intravenous Given 06/01/23 2234)    ED Course/ Medical Decision Making/ A&P                             Medical Decision Making Amount and/or Complexity of Data Reviewed Labs: ordered.  Risk Prescription drug management.   This patient presents to the ED for concern of allergic rxn, this involves an extensive number of treatment options, and is a complaint that carries with it a high risk of complications and morbidity.  The differential diagnosis includes allergic rxn, anaphylaxis   Co morbidities that complicate the patient evaluation  ectal cancer, inguinal hernia, and GERD   Additional history obtained:  Additional history obtained from epic chart review External records from outside source obtained and reviewed including wife   Lab Tests:  I Ordered, and personally interpreted labs.  The pertinent results include:  cbc with wbc 11.1, otherwise nl   Cardiac Monitoring:  The patient was maintained on a cardiac monitor.  I personally viewed and interpreted the cardiac monitored which showed an underlying rhythm of: nsr   Medicines ordered and prescription drug management:  I ordered medication including solumedrol and pepcid  for allergy sx  Reevaluation of the patient after these  medicines showed that the patient improved I have reviewed the patients home medicines and have made adjustments as needed   Problem List / ED Course:  Allergic rxn:  pt given solumedrol and pepcid.  He is much improved.  He is stable for d/c.  Return if worse.  F/u with pcp.   Reevaluation:  After the interventions noted above, I reevaluated the patient and found that they have :improved   Social Determinants of Health:  Lives at home   Dispostion:  After consideration of the diagnostic results and the patients response to treatment, I feel that the patent would benefit from discharge with outpatient f/u.          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

## 2023-07-03 LAB — LAB REPORT - SCANNED: EGFR: 100

## 2023-09-01 ENCOUNTER — Ambulatory Visit: Payer: 59 | Admitting: Interventional Cardiology

## 2023-10-05 ENCOUNTER — Ambulatory Visit: Payer: 59 | Attending: Interventional Cardiology | Admitting: Cardiology

## 2023-10-05 ENCOUNTER — Encounter: Payer: Self-pay | Admitting: Cardiology

## 2023-10-05 VITALS — BP 122/70 | HR 70 | Ht 72.0 in | Wt 203.0 lb

## 2023-10-05 DIAGNOSIS — I2510 Atherosclerotic heart disease of native coronary artery without angina pectoris: Secondary | ICD-10-CM | POA: Diagnosis not present

## 2023-10-05 DIAGNOSIS — E7800 Pure hypercholesterolemia, unspecified: Secondary | ICD-10-CM | POA: Diagnosis not present

## 2023-10-05 DIAGNOSIS — Z8249 Family history of ischemic heart disease and other diseases of the circulatory system: Secondary | ICD-10-CM

## 2023-10-05 MED ORDER — EZETIMIBE 10 MG PO TABS: 10.0000 mg | ORAL_TABLET | Freq: Every day | ORAL | 3 refills | Status: DC

## 2023-10-05 NOTE — Patient Instructions (Addendum)
Medication Instructions:  Start Zetia 10 mg  daily *If you need a refill on your cardiac medications before your next appointment, please call your pharmacy*   Lab Work: Repeat lipids in 3 months fasting  If you have labs (blood work) drawn today and your tests are completely normal, you will receive your results only by: MyChart Message (if you have MyChart) OR A paper copy in the mail If you have any lab test that is abnormal or we need to change your treatment, we will call you to review the results.   Testing/Procedures: None    Follow-Up: At [facility], you and your health needs are our priority.  As part of our continuing mission to provide you with exceptional heart care, we have created designated Provider Care Teams.  These Care Teams include your primary Cardiologist (physician) and Advanced Practice Providers (APPs -  Physician Assistants and Nurse Practitioners) who all work together to provide you with the care you need, when you need it.    Your next appointment:   1 year(s)  Provider: Peter [place]

## 2024-01-05 ENCOUNTER — Other Ambulatory Visit: Payer: Self-pay

## 2024-01-05 DIAGNOSIS — E782 Mixed hyperlipidemia: Secondary | ICD-10-CM

## 2024-01-05 LAB — LIPID PANEL
Chol/HDL Ratio: 3.9 ratio (ref 0.0–5.0)
Cholesterol, Total: 201 mg/dL — ABNORMAL HIGH (ref 100–199)
HDL: 51 mg/dL
LDL Chol Calc (NIH): 133 mg/dL — ABNORMAL HIGH (ref 0–99)
Triglycerides: 94 mg/dL (ref 0–149)
VLDL Cholesterol Cal: 17 mg/dL (ref 5–40)

## 2024-02-17 ENCOUNTER — Ambulatory Visit: Payer: 59

## 2024-03-08 LAB — LAB REPORT - SCANNED: EGFR: 100

## 2024-03-10 ENCOUNTER — Encounter: Payer: Self-pay | Admitting: Cardiology

## 2024-03-14 ENCOUNTER — Telehealth: Payer: Self-pay

## 2024-03-14 ENCOUNTER — Other Ambulatory Visit: Payer: Self-pay

## 2024-03-14 DIAGNOSIS — E782 Mixed hyperlipidemia: Secondary | ICD-10-CM

## 2024-03-14 NOTE — Telephone Encounter (Signed)
 Marland Kitchen

## 2024-03-14 NOTE — Telephone Encounter (Signed)
 Called patient left Dr.Jordan's advice on personal voice mail.Advised I will mail lab order to be done in 2 months.

## 2024-03-21 ENCOUNTER — Ambulatory Visit: Admitting: Pharmacist Clinician (PhC)/ Clinical Pharmacy Specialist

## 2024-05-17 ENCOUNTER — Ambulatory Visit: Payer: Self-pay | Admitting: Cardiology

## 2024-05-17 LAB — HEPATIC FUNCTION PANEL
ALT: 17 IU/L (ref 0–44)
AST: 21 IU/L (ref 0–40)
Albumin: 4.4 g/dL (ref 3.8–4.9)
Alkaline Phosphatase: 82 IU/L (ref 44–121)
Bilirubin Total: 0.4 mg/dL (ref 0.0–1.2)
Bilirubin, Direct: 0.14 mg/dL (ref 0.00–0.40)
Total Protein: 6.7 g/dL (ref 6.0–8.5)

## 2024-05-17 LAB — LIPID PANEL
Chol/HDL Ratio: 2.8 ratio (ref 0.0–5.0)
Cholesterol, Total: 156 mg/dL (ref 100–199)
HDL: 56 mg/dL (ref >39)
LDL Chol Calc (NIH): 87 mg/dL (ref 0–99)
Triglycerides: 64 mg/dL (ref 0–149)
VLDL Cholesterol Cal: 13 mg/dL (ref 5–40)

## 2024-06-13 ENCOUNTER — Other Ambulatory Visit: Payer: Self-pay

## 2024-06-13 DIAGNOSIS — E782 Mixed hyperlipidemia: Secondary | ICD-10-CM

## 2024-07-15 ENCOUNTER — Telehealth: Payer: Self-pay | Admitting: Cardiology

## 2024-07-15 ENCOUNTER — Other Ambulatory Visit: Payer: Self-pay

## 2024-07-15 DIAGNOSIS — E7800 Pure hypercholesterolemia, unspecified: Secondary | ICD-10-CM

## 2024-07-15 NOTE — Telephone Encounter (Signed)
 Pt is requesting a lab order be put in to check his lipids. Please advise

## 2024-07-15 NOTE — Telephone Encounter (Signed)
 Spoke to patient he stated he has appointment with PharmD in lipid clinic 9/9.He would like to have lipid panel done before appointment.Advised I will place order.He can have done at [facility].

## 2024-07-21 ENCOUNTER — Encounter: Payer: Self-pay | Admitting: Cardiology

## 2024-07-27 NOTE — Telephone Encounter (Signed)
 Spoke to patient he stated he stopped taking a statin last November due to having blisters on both feet.He started having blisters again recently on both feet again with no statin.He saw a dermatologist was told he had dermatitis and eczema.He will be having repeat fasting lipid profile next week.He wanted Dr.Jordan to know he will start a statin if needed instead of seeing PharmD in lipid clinic.I will send message to Dr.Jordan.

## 2024-08-05 LAB — LIPID PANEL
Chol/HDL Ratio: 2.6 ratio (ref 0.0–5.0)
Cholesterol, Total: 153 mg/dL (ref 100–199)
HDL: 58 mg/dL (ref >39)
LDL Chol Calc (NIH): 84 mg/dL (ref 0–99)
Triglycerides: 54 mg/dL (ref 0–149)
VLDL Cholesterol Cal: 11 mg/dL (ref 5–40)

## 2024-08-09 ENCOUNTER — Ambulatory Visit: Payer: Self-pay | Admitting: Cardiology

## 2024-08-09 ENCOUNTER — Ambulatory Visit: Payer: Self-pay | Admitting: Pharmacist Clinician (PhC)/ Clinical Pharmacy Specialist

## 2024-08-09 DIAGNOSIS — E782 Mixed hyperlipidemia: Secondary | ICD-10-CM

## 2024-08-12 MED ORDER — ROSUVASTATIN CALCIUM 5 MG PO TABS: 5.0000 mg | ORAL_TABLET | Freq: Every day | ORAL | 3 refills | Status: AC

## 2024-08-12 NOTE — Telephone Encounter (Signed)
 Spoke to patient Dr.Jordan advised take Rosuvastatin  5 mg daily.Repeat fasting lipid and hepatic panels in 3 months.

## 2024-10-01 ENCOUNTER — Other Ambulatory Visit: Payer: Self-pay | Admitting: Cardiology

## 2024-10-05 ENCOUNTER — Ambulatory Visit: Admitting: Cardiology

## 2024-10-17 ENCOUNTER — Encounter: Payer: Self-pay | Admitting: Cardiology

## 2024-11-09 LAB — HEPATIC FUNCTION PANEL
ALT: 36 IU/L (ref 0–44)
AST: 30 IU/L (ref 0–40)
Albumin: 4.6 g/dL (ref 3.8–4.9)
Alkaline Phosphatase: 95 IU/L (ref 47–123)
Bilirubin Total: 0.4 mg/dL (ref 0.0–1.2)
Bilirubin, Direct: 0.16 mg/dL (ref 0.00–0.40)
Total Protein: 7.7 g/dL (ref 6.0–8.5)

## 2024-11-09 LAB — LIPID PANEL
Chol/HDL Ratio: 2.2 ratio (ref 0.0–5.0)
Cholesterol, Total: 146 mg/dL (ref 100–199)
HDL: 66 mg/dL (ref >39)
LDL Chol Calc (NIH): 65 mg/dL (ref 0–99)
Triglycerides: 81 mg/dL (ref 0–149)
VLDL Cholesterol Cal: 15 mg/dL (ref 5–40)

## 2024-11-14 NOTE — Progress Notes (Unsigned)
 Cardiology Office Note:    Date:  11/16/2024   ID:  Devon Bray, DOB 09-21-1966, MRN [id number]  PCP:  Arloa Elsie SAUNDERS, MD   [facility] Providers Cardiologist:  Lateia Fraser, MD     Referring MD: Arloa Elsie SAUNDERS, MD   Chief Complaint  Patient presents with   Coronary Artery Disease    History of Present Illness:    CHAD DONOGHUE is a 58 y.o. male seen for follow up of DOE. Previously seen by Dr Dann in April 2023 for this. Coronary CTA showed a calcium  score of 126 with mild nonobstructive CAD. Aorta was normal in size. Echo was normal. Risk factor modification recommended. He does have a family history of CAD. History of HLD.   On follow up today he is doing well. Working out at gym 3 days a week. Does a lot of walking on the job. No chest pain or dyspnea. Did have a bulous rash and there was concern this may have been drug related. He states dermatology did biopsy and did not think it was a drug reaction.  Past Medical History:  Diagnosis Date   Cancer (HCC) 12/10/10   rectal   GERD (gastroesophageal reflux disease)    Hx of radiation therapy 02/10/11 to 03/20/11   rectum   Inguinal hernia    Inguinal hernia    right   Rectal cancer (HCC)    rectal ca dx 10/2010    Past Surgical History:  Procedure Laterality Date   HERNIA REPAIR Left 2013   HYDROCELE EXCISION Right 12/29/2022   Procedure: HYDROCELECTOMY ADULT;  Surgeon: Matilda Senior, MD;  Location: [facility];  Service: Urology;  Laterality: Right;  45 MINS   INGUINAL HERNIA REPAIR Right 10/29/2017   Procedure: REPAIR RIGHT HERNIA  INGUINAL WITH MESH ERAS PATHWAY;  Surgeon: Vanderbilt Ned, MD;  Location: [facility];  Service: General;  Laterality: Right;   insert port a cath     INSERTION OF MESH Right 10/29/2017   Procedure: INSERTION OF MESH;  Surgeon: Vanderbilt Ned, MD;  Location: [facility];  Service: General;  Laterality: Right;    PORT-A-CATH REMOVAL  12/22/2011   Procedure: REMOVAL PORT-A-CATH;  Surgeon: Ned LABOR. Cornett, MD;  Location: [facility];  Service: General;  Laterality: N/A;   RECTAL SURGERY     thrombosed hemorroid      Current Medications: Current Meds  Medication Sig   EPINEPHrine  0.3 mg/0.3 mL IJ SOAJ injection Inject 0.3 mg into the muscle as needed for anaphylaxis.   ezetimibe  (ZETIA ) 10 MG tablet TAKE 1 TABLET BY MOUTH EVERY DAY   fish oil-omega-3 fatty acids 1000 MG capsule Take 1 g by mouth daily.    Multiple Vitamin (MULTIVITAMIN) tablet Take 1 tablet by mouth daily.   Probiotic Product (ALIGN PO) Take 1 tablet by mouth daily.   rosuvastatin  (CRESTOR ) 5 MG tablet Take 1 tablet (5 mg total) by mouth daily.   triamcinolone (NASACORT) 55 MCG/ACT AERO nasal inhaler Place 1 spray into the nose daily.     Allergies:   Sulfamethoxazole-trimethoprim, Bee venom, Bovine (beef) protein-containing drug products, Penicillin g, Penicillin v potassium, Penicillins, Porcine (pork) protein-containing drug products, and Rosuvastatin    Social History   Socioeconomic History   Marital status: Married    Spouse name: Not on file   Number of children: Not on file   Years of education: Not on file   Highest education level: Not on file  Occupational  History   Not on file  Tobacco Use   Smoking status: Never    Passive exposure: Never   Smokeless tobacco: Never  Substance and Sexual Activity   Alcohol use: No   Drug use: No   Sexual activity: Not on file  Other Topics Concern   Not on file  Social History Narrative   Not on file   Social Drivers of Health   Tobacco Use: Low Risk (11/16/2024)   Patient History    Smoking Tobacco Use: Never    Smokeless Tobacco Use: Never    Passive Exposure: Never  Financial Resource Strain: Not on file  Food Insecurity: Not on file  Transportation Needs: Not on file  Physical Activity: Not on file  Stress: Not on file  Social Connections: Not on file   Depression (EYV7-0): Not on file  Alcohol Screen: Not on file  Housing: Not on file  Utilities: Not on file  Health Literacy: Not on file     Family History: The patient's family history includes Breast cancer in his cousin; Breast cancer (age of onset: 66) in his mother; Colon cancer (age of onset: 46) in his paternal uncle; Heart disease in his father; Lung cancer in his maternal aunt, maternal grandmother, and paternal aunt; Lymphoma in his maternal aunt; Prostate cancer (age of onset: 62) in his father; Stroke in his father.  ROS:   Please see the history of present illness.     All other systems reviewed and are negative.  EKGs/Labs/Other Studies Reviewed:    The following studies were reviewed today: Echo 04/23/22: IMPRESSIONS     1. Left ventricular ejection fraction, by estimation, is 60 to 65%. Left  ventricular ejection fraction by 3D volume is 65 %. The left ventricle has  normal function. The left ventricle has no regional wall motion  abnormalities. Left ventricular diastolic   parameters were normal. The average left ventricular global longitudinal  strain is -22.9 %. The global longitudinal strain is normal.   2. Right ventricular systolic function is normal. The right ventricular  size is normal.   3. The mitral valve is normal in structure. Trivial mitral valve  regurgitation. No evidence of mitral stenosis.   4. The aortic valve is tricuspid. Aortic valve regurgitation is trivial.  No aortic stenosis is present.   5. There is borderline dilatation of the ascending aorta, measuring 39  mm.   6. The inferior vena cava is normal in size with greater than 50%  respiratory variability, suggesting right atrial pressure of 3 mmHg.   Cardiac/Coronary CTA   TECHNIQUE: The patient was scanned on a [employer].   FINDINGS: A 100 kV prospective scan was triggered in the descending thoracic aorta at 111 HU's. Axial non-contrast 3 mm slices were carried  out through the heart. The data set was analyzed on a dedicated work station and scored using the Agatson method. Gantry rotation speed was 250 msecs and collimation was .6 mm. 0.8 mg of sl NTG was given. The 3D data set was reconstructed in 5% intervals of the 35-75 % of the R-R cycle. Phases were analyzed on a dedicated work station using MPR, MIP and VRT modes. The patient received 80 cc of contrast.   Coronary Arteries:  Normal coronary origin.  Codominance.   RCA is a codominant artery. Calcified plaque in distal RCA causes 25-49% stenosis.   Left main is a large artery that gives rise to LAD and LCX arteries.   LAD is  a large vessel. Mixed plaque in proximal LAD causes 0-24% stenosis. Calcified plaque in D1 causes 0-24% stenosis   LCX is a codominant artery that gives rise to one large OM1 branch. Calcified plaque in proximal LCX causes 0-24% stenosis.   Other findings:   Left Ventricle: Mild dilatation   Left Atrium: Normal size   Pulmonary Veins: Normal configuration   Right Ventricle: Mild dilatation   Right Atrium: Normal size   Cardiac valves: No calcifications   Thoracic aorta: Normal size   Pulmonary Arteries: Dilated main pulmonary artery measuring 30mm   Systemic Veins: Normal drainage   Pericardium: Normal thickness   IMPRESSION: 1. Coronary calcium  score of 126. This was 60th percentile for age and sex matched control.   2.  Normal coronary origin with codominance.   3. Nonobstructive CAD. Most significant lesion is calcified plaque in distal RCA causing mild (25-49%) stenosis   4. Mild dilatation of LV/RV, recommend echocardiogram for further evaluation   CAD-RADS 2. Mild non-obstructive CAD (25-49%). Consider non-atherosclerotic causes of chest pain. Consider preventive therapy and risk factor modification.     Electronically Signed   By: Lonni Nanas M.D.   On: 04/09/2022 11:11   EKG Interpretation Date/Time:  Wednesday  November 16 2024 09:51:26 EST Ventricular Rate:  63 PR Interval:  190 QRS Duration:  112 QT Interval:  426 QTC Calculation: 435 R Axis:   38  Text Interpretation: Normal sinus rhythm Incomplete right bundle branch block When compared with ECG of 01-Jun-2023 22:27, No significant change was found Confirmed by Jailyn Leeson [phone]) on 11/16/2024 9:58:05 AM    Recent Labs: 11/09/2024: ALT 36  Recent Lipid Panel    Component Value Date/Time   CHOL 146 11/09/2024 0807   TRIG 81 11/09/2024 0807   HDL 66 11/09/2024 0807   CHOLHDL 2.2 11/09/2024 0807   LDLCALC 65 11/09/2024 0807   Dated 03/06/23: cholesterol 159, triglycerides 87, HDL 54, LDL 88. TSH normal.  Dated 07/03/23: normal CBC  EKG Interpretation Date/Time:  Wednesday November 16 2024 09:51:26 EST Ventricular Rate:  63 PR Interval:  190 QRS Duration:  112 QT Interval:  426 QTC Calculation: 435 R Axis:   38  Text Interpretation: Normal sinus rhythm Incomplete right bundle branch block When compared with ECG of 01-Jun-2023 22:27, No significant change was found Confirmed by Octaviano Mukai [phone]) on 11/16/2024 9:58:05 AM    Risk Assessment/Calculations:                Physical Exam:    VS:  BP 121/83   Pulse 63   Ht 6' (1.829 m)   Wt 189 lb (85.7 kg)   SpO2 98%   BMI 25.63 kg/m     Wt Readings from Last 3 Encounters:  11/16/24 189 lb (85.7 kg)  10/05/23 203 lb (92.1 kg)  06/01/23 195 lb (88.5 kg)     GEN:  Well nourished, well developed in no acute distress HEENT: Normal NECK: No JVD; No carotid bruits LYMPHATICS: No lymphadenopathy CARDIAC: RRR, no murmurs, rubs, gallops RESPIRATORY:  Clear to auscultation without rales, wheezing or rhonchi  ABDOMEN: Soft, non-tender, non-distended MUSCULOSKELETAL:  No edema; No deformity  SKIN: Warm and dry NEUROLOGIC:  Alert and oriented x 3 PSYCHIATRIC:  Normal affect   ASSESSMENT:    1. Mixed hyperlipidemia   2. Hypercholesteremia   3. Family history of  coronary arteriosclerosis   4. Coronary artery disease involving native coronary artery of native heart without angina pectoris  PLAN:    In order of problems listed above:  Nonobstructive CAD based on coronary CTA. No active cardiac symptoms. Focus on healthy lifestyle and risk factor modification Hypercholesterolemia. Intolerant of higher dose Crestor  due to  GI symptoms. Doing well on low dose + Zetia . LDL 65.      Follow up in one year   Medication Adjustments/Labs and Tests Ordered: Current medicines are reviewed at length with the patient today.  Concerns regarding medicines are outlined above.  Orders Placed This Encounter  Procedures   EKG 12-Lead   No orders of the defined types were placed in this encounter.   There are no Patient Instructions on file for this visit.   Signed, Vernette Moise, MD  11/16/2024 10:06 AM    [facility]

## 2024-11-16 ENCOUNTER — Encounter: Payer: Self-pay | Admitting: Cardiology

## 2024-11-16 ENCOUNTER — Ambulatory Visit: Payer: Self-pay | Admitting: Cardiology

## 2024-11-16 VITALS — BP 121/83 | HR 63 | Ht 72.0 in | Wt 189.0 lb

## 2024-11-16 DIAGNOSIS — Z8249 Family history of ischemic heart disease and other diseases of the circulatory system: Secondary | ICD-10-CM | POA: Diagnosis not present

## 2024-11-16 DIAGNOSIS — E782 Mixed hyperlipidemia: Secondary | ICD-10-CM | POA: Diagnosis not present

## 2024-11-16 DIAGNOSIS — I2510 Atherosclerotic heart disease of native coronary artery without angina pectoris: Secondary | ICD-10-CM

## 2024-11-16 DIAGNOSIS — E7800 Pure hypercholesterolemia, unspecified: Secondary | ICD-10-CM

## 2024-11-16 NOTE — Patient Instructions (Signed)
 Medication Instructions:  Continue same medications *If you need a refill on your cardiac medications before your next appointment, please call your pharmacy*  Lab Work: None ordered  Testing/Procedures: None ordered  Follow-Up: At [facility], you and your health needs are our priority.  As part of our continuing mission to provide you with exceptional heart care, our providers are all part of one team.  This team includes your primary Cardiologist (physician) and Advanced Practice Providers or APPs (Physician Assistants and Nurse Practitioners) who all work together to provide you with the care you need, when you need it.  Your next appointment:  1 year     Call in August to schedule Dec appointment     Provider:  Dr.Jordan   We recommend signing up for the patient portal called MyChart.  Sign up information is provided on this After Visit Summary.  MyChart is used to connect with patients for Virtual Visits (Telemedicine).  Patients are able to view lab/test results, encounter notes, upcoming appointments, etc.  Non-urgent messages can be sent to your provider as well.   To learn more about what you can do with MyChart, go to [email or web address].
